# Patient Record
Sex: Male | Born: 2000 | Race: Black or African American | Hispanic: No | Marital: Single | Smoking: Former smoker
Health system: Southern US, Community
[De-identification: ages and names within clinical notes are randomized; demographics above are authoritative.]

## PROBLEM LIST (undated history)

## (undated) DIAGNOSIS — G4730 Sleep apnea, unspecified: Secondary | ICD-10-CM

## (undated) DIAGNOSIS — J189 Pneumonia, unspecified organism: Secondary | ICD-10-CM

## (undated) DIAGNOSIS — J302 Other seasonal allergic rhinitis: Secondary | ICD-10-CM

## (undated) HISTORY — PX: FRACTURE SURGERY: SHX138

## (undated) HISTORY — PX: WISDOM TOOTH EXTRACTION: SHX21

---
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
## 2018-01-17 ENCOUNTER — Ambulatory Visit (HOSPITAL_COMMUNITY)
Admission: EM | Admit: 2018-01-17 | Discharge: 2018-01-17 | Disposition: A | Discharge status: Home / Self Care | Payer: Medicaid Other | Attending: Family Medicine | Admitting: Family Medicine

## 2018-01-17 ENCOUNTER — Encounter (HOSPITAL_COMMUNITY): Payer: Self-pay | Admitting: Emergency Medicine

## 2018-01-17 DIAGNOSIS — J029 Acute pharyngitis, unspecified: Secondary | ICD-10-CM | POA: Insufficient documentation

## 2018-01-17 DIAGNOSIS — J020 Streptococcal pharyngitis: Secondary | ICD-10-CM | POA: Diagnosis not present

## 2018-01-17 LAB — POCT RAPID STREP A: STREPTOCOCCUS, GROUP A SCREEN (DIRECT): NEGATIVE

## 2018-01-17 NOTE — Discharge Instructions (Addendum)
You may use over the counter ibuprofen or acetaminophen as needed.  For a sore throat, over the counter products such as [employer] Peroxyl Mouth Sore Rinse or Chloraseptic Sore Throat Spray may provide some temporary relief. Your rapid strep test was negative today. We have sent your throat swab for culture and will let you know of any positive results. 

## 2018-01-17 NOTE — ED Triage Notes (Signed)
Pt sts URI sx and sore throat  

## 2018-01-17 NOTE — ED Provider Notes (Signed)
  [facility] CARE CENTER   [id number] 01/17/18 Arrival Time: 1219  ASSESSMENT & PLAN:  1. Sore throat    Results for orders placed or performed during the hospital encounter of 01/17/18  POCT rapid strep A [facility])  Result Value Ref Range   Streptococcus, Group A Screen (Direct) NEGATIVE NEGATIVE   Labs Reviewed  CULTURE, GROUP A STREP [facility])  POCT RAPID STREP A   OTC analgesics and throat care as needed. Will call if culture returns +.  Reviewed expectations re: course of current medical issues. Questions answered. Outlined signs and symptoms indicating need for more acute intervention. Patient verbalized understanding. After Visit Summary given.   SUBJECTIVE:  Justin Fletcher is a 17 y.o. male who reports a sore throat. Describes as discomfort with swallowing. Onset gradual beginning 1-2 days ago. No respiratory symptoms. Normal PO intake but reports discomfort with swallowing. Fever reported: no. No associated n/v/abdominal symptoms. Sick contacts: none.  OTC treatment: none reported.  ROS: As per HPI.   OBJECTIVE:  Vitals:   01/17/18 1259  BP: 121/75  Pulse: 77  Resp: 18  Temp: 98.4 F (36.9 C)  TempSrc: Oral  SpO2: 99%     General appearance: alert; no distress HEENT: throat with mild erythema; uvula midline yes Neck: supple with FROM; no lymphadenopathy Lungs: clear to auscultation bilaterally Skin: reveals no rash; warm and dry Psychological: alert and cooperative; normal mood and affect  No Known Allergies  Past Medical History:  Diagnosis Date  . Asthma   . Bronchitis   . Pneumonia   . Seasonal allergies    Social History   Socioeconomic History  . Marital status: Single    Spouse name: Not on file  . Number of children: Not on file  . Years of education: Not on file  . Highest education level: Not on file  Occupational History  . Not on file  Social Needs  . Financial resource strain: Not on file  . Food insecurity:    Worry:  Not on file    Inability: Not on file  . Transportation needs:    Medical: Not on file    Non-medical: Not on file  Tobacco Use  . Smoking status: Never Smoker  . Smokeless tobacco: Never Used  Substance and Sexual Activity  . Alcohol use: No  . Drug use: No  . Sexual activity: Not on file  Lifestyle  . Physical activity:    Days per week: Not on file    Minutes per session: Not on file  . Stress: Not on file  Relationships  . Social connections:    Talks on phone: Not on file    Gets together: Not on file    Attends religious service: Not on file    Active member of club or organization: Not on file    Attends meetings of clubs or organizations: Not on file    Relationship status: Not on file  . Intimate partner violence:    Fear of current or ex partner: Not on file    Emotionally abused: Not on file    Physically abused: Not on file    Forced sexual activity: Not on file  Other Topics Concern  . Not on file  Social History Narrative  . Not on file   History reviewed. No pertinent family history.        Mardella Layman, MD 01/18/18 [phone]

## 2018-01-19 LAB — CULTURE, GROUP A STREP (THRC)

## 2018-01-23 ENCOUNTER — Other Ambulatory Visit: Payer: Self-pay

## 2018-01-23 ENCOUNTER — Encounter (HOSPITAL_COMMUNITY): Payer: Self-pay

## 2018-01-23 ENCOUNTER — Ambulatory Visit (HOSPITAL_COMMUNITY)
Admission: EM | Admit: 2018-01-23 | Discharge: 2018-01-23 | Disposition: A | Discharge status: Home / Self Care | Payer: Medicaid Other | Attending: Family Medicine | Admitting: Family Medicine

## 2018-01-23 DIAGNOSIS — J302 Other seasonal allergic rhinitis: Secondary | ICD-10-CM

## 2018-01-23 DIAGNOSIS — H66003 Acute suppurative otitis media without spontaneous rupture of ear drum, bilateral: Secondary | ICD-10-CM

## 2018-01-23 DIAGNOSIS — J4521 Mild intermittent asthma with (acute) exacerbation: Secondary | ICD-10-CM | POA: Diagnosis not present

## 2018-01-23 MED ORDER — OLOPATADINE HCL 0.2 % OP SOLN: 1.0000 [drp] | Freq: Every morning | OPHTHALMIC | 3 refills | Status: DC

## 2018-01-23 MED ORDER — FLUTICASONE PROPIONATE 50 MCG/ACT NA SUSP: 2.0000 | Freq: Every day | NASAL | 12 refills | Status: DC

## 2018-01-23 MED ORDER — ALBUTEROL SULFATE HFA 108 (90 BASE) MCG/ACT IN AERS: 1.0000 | INHALATION_SPRAY | Freq: Four times a day (QID) | RESPIRATORY_TRACT | 11 refills | Status: DC | PRN

## 2018-01-23 MED ORDER — AMOXICILLIN 875 MG PO TABS: 875.0000 mg | ORAL_TABLET | Freq: Two times a day (BID) | ORAL | 0 refills | Status: DC

## 2018-01-23 NOTE — ED Provider Notes (Signed)
[facility] CARE CENTER   [id number] 01/23/18 Arrival Time: 1818   SUBJECTIVE:  Justin Fletcher is a 17 y.o. male who presents to the urgent care with complaint of bilateral ear pain since yesterday 01/22/18, pt complains of water feeling in ears also when he talks sounds muffled   Patient has asthma and seasonal allergies.  His nose been stuffy and his eyes have been watery, swollen, and red.  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  He hopes to go to truck driving school next.  Past Medical History:  Diagnosis Date  . Asthma   . Bronchitis   . Pneumonia   . Seasonal allergies    History reviewed. No pertinent family history. Social History   Socioeconomic History  . Marital status: Single    Spouse name: Not on file  . Number of children: Not on file  . Years of education: Not on file  . Highest education level: Not on file  Occupational History  . Not on file  Social Needs  . Financial resource strain: Not on file  . Food insecurity:    Worry: Not on file    Inability: Not on file  . Transportation needs:    Medical: Not on file    Non-medical: Not on file  Tobacco Use  . Smoking status: Never Smoker  . Smokeless tobacco: Never Used  Substance and Sexual Activity  . Alcohol use: No  . Drug use: No  . Sexual activity: Never  Lifestyle  . Physical activity:    Days per week: Not on file    Minutes per session: Not on file  . Stress: Not on file  Relationships  . Social connections:    Talks on phone: Not on file    Gets together: Not on file    Attends religious service: Not on file    Active member of club or organization: Not on file    Attends meetings of clubs or organizations: Not on file    Relationship status: Not on file  . Intimate partner violence:    Fear of current or ex partner: Not on file    Emotionally abused: Not on file    Physically abused: Not on file    Forced sexual activity: Not on file  Other Topics Concern  . Not on file    Social History Narrative  . Not on file   Current Meds  Medication Sig  . [DISCONTINUED] albuterol (PROVENTIL HFA;VENTOLIN HFA) 108 (90 Base) MCG/ACT inhaler Inhale 1-2 puffs into the lungs every 6 (six) hours as needed for wheezing or shortness of breath.  . [DISCONTINUED] albuterol (PROVENTIL HFA;VENTOLIN HFA) 108 (90 Base) MCG/ACT inhaler Inhale 1-2 puffs every 6 (six) hours as needed into the lungs for wheezing or shortness of breath.   No Known Allergies    ROS: As per HPI, remainder of ROS negative.   OBJECTIVE:   Vitals:   01/23/18 1850 01/23/18 1852  BP: 132/81   Pulse: 92   Resp: 18   Temp: 97.7 F (36.5 C)   TempSrc: Oral   SpO2: 97% 97%     General appearance: alert; no distress Eyes: PERRL; EOMI; conjunctiva erythematous bilaterally with mildly swollen eyelids HENT: normocephalic; atraumatic; TMs bilateral bulging eardrums with erythema and fluid level, canal normal, external ears normal without trauma; nasal mucosa normal; oral mucosa normal Neck: supple Lungs: Wheezing on auscultation bilaterally Heart: regular rate and rhythm Back: no CVA tenderness Extremities: no cyanosis or edema; symmetrical with  no gross deformities Skin: warm and dry Neurologic: normal gait; grossly normal Psychological: alert and cooperative; normal mood and affect      Labs:  Results for orders placed or performed during the hospital encounter of 01/17/18  Culture, group A strep  Result Value Ref Range   Specimen Description THROAT    Special Requests NONE    Culture      NO GROUP A STREP (S.PYOGENES) ISOLATED Performed at [facility], [address] [place], [place] [address]    Report Status 01/19/2018 FINAL   POCT rapid strep A [facility])  Result Value Ref Range   Streptococcus, Group A Screen (Direct) NEGATIVE NEGATIVE    Labs Reviewed - No data to display  No results found.     ASSESSMENT & PLAN:  1. Non-recurrent acute suppurative  otitis media of both ears without spontaneous rupture of tympanic membranes   2. Seasonal allergies   3. Mild intermittent asthma with acute exacerbation   Patient has multiple symptoms referable to seasonal allergies.  The ear infections are quite significant but patient is able to go to school anyway.  His asthma is mild but persistent lately  Meds ordered this encounter  Medications  . albuterol (PROVENTIL HFA;VENTOLIN HFA) 108 (90 Base) MCG/ACT inhaler    Sig: Inhale 1-2 puffs into the lungs every 6 (six) hours as needed for wheezing or shortness of breath.    Dispense:  1 Inhaler    Refill:  11  . fluticasone (FLONASE) 50 MCG/ACT nasal spray    Sig: Place 2 sprays into both nostrils daily.    Dispense:  16 g    Refill:  12  . amoxicillin (AMOXIL) 875 MG tablet    Sig: Take 1 tablet (875 mg total) by mouth 2 (two) times daily.    Dispense:  20 tablet    Refill:  0  . Olopatadine HCl (PATADAY) 0.2 % SOLN    Sig: Apply 1 drop to eye every morning.    Dispense:  2.5 mL    Refill:  3    Reviewed expectations re: course of current medical issues. Questions answered. Outlined signs and symptoms indicating need for more acute intervention. Patient verbalized understanding. After Visit Summary given.    Procedures:      Elvina Sidle, MD 01/23/18 1909

## 2018-01-23 NOTE — ED Triage Notes (Addendum)
Patient presents to [facility] for bilateral ear pain since yesterday 01/22/18, pt complains of water feeling in ears also when he talks sounds muffled

## 2018-02-23 ENCOUNTER — Encounter (HOSPITAL_COMMUNITY): Payer: Self-pay | Admitting: Emergency Medicine

## 2018-02-23 ENCOUNTER — Emergency Department (HOSPITAL_COMMUNITY)
Admission: EM | Admit: 2018-02-23 | Discharge: 2018-02-24 | Disposition: A | Discharge status: Home / Self Care | Payer: Medicaid Other | Attending: Physician Assistant | Admitting: Physician Assistant

## 2018-02-23 DIAGNOSIS — Z79899 Other long term (current) drug therapy: Secondary | ICD-10-CM | POA: Diagnosis not present

## 2018-02-23 DIAGNOSIS — J302 Other seasonal allergic rhinitis: Secondary | ICD-10-CM | POA: Insufficient documentation

## 2018-02-23 DIAGNOSIS — J039 Acute tonsillitis, unspecified: Secondary | ICD-10-CM | POA: Diagnosis not present

## 2018-02-23 DIAGNOSIS — J45909 Unspecified asthma, uncomplicated: Secondary | ICD-10-CM | POA: Diagnosis not present

## 2018-02-23 DIAGNOSIS — J029 Acute pharyngitis, unspecified: Secondary | ICD-10-CM | POA: Diagnosis present

## 2018-02-23 LAB — GROUP A STREP BY PCR: Group A Strep by PCR: NOT DETECTED

## 2018-02-23 MED ORDER — ACETAMINOPHEN 325 MG PO TABS
650.0000 mg | ORAL_TABLET | Freq: Once | ORAL | Status: AC | PRN
  Administered 2018-02-23: 650 mg via ORAL
  Filled 2018-02-23: qty 2

## 2018-02-23 NOTE — ED Triage Notes (Signed)
Pt states sore throat with body aches since yesterday. Also reports cough with congestion.

## 2018-02-24 MED ORDER — PENICILLIN V POTASSIUM 500 MG PO TABS: 500.0000 mg | ORAL_TABLET | Freq: Four times a day (QID) | ORAL | 0 refills | Status: AC

## 2018-02-24 NOTE — ED Provider Notes (Signed)
[facility] Provider Note   CSN: [id number] Arrival date & time: 02/23/18  2237     History   Chief Complaint No chief complaint on file.   HPI Justin HandlerJaidyn Fletcher is a 17 y.o. male.  The history is provided by the patient. No language interpreter was used.  Sore Throat  This is a new problem. The current episode started yesterday. The problem occurs constantly. The problem has been gradually worsening. Nothing aggravates the symptoms. Nothing relieves the symptoms. He has tried nothing for the symptoms. The treatment provided no relief.  Pt complains of a sore throat.  Pt complains of pain with swallowing    Past Medical History:  Diagnosis Date  . Asthma   . Bronchitis   . Pneumonia   . Seasonal allergies     There are no active problems to display for this patient.   Past Surgical History:  Procedure Laterality Date  . HAND SURGERY          Home Medications    Prior to Admission medications   Medication Sig Start Date End Date Taking? Authorizing Provider  albuterol (PROVENTIL HFA;VENTOLIN HFA) 108 (90 Base) MCG/ACT inhaler Inhale 1-2 puffs into the lungs every 6 (six) hours as needed for wheezing or shortness of breath. 01/23/18   Elvina SidleLauenstein, Kurt, MD  amoxicillin (AMOXIL) 875 MG tablet Take 1 tablet (875 mg total) by mouth 2 (two) times daily. 01/23/18   Elvina SidleLauenstein, Kurt, MD  fluticasone (FLONASE) 50 MCG/ACT nasal spray Place 2 sprays into both nostrils daily. 01/23/18   Elvina SidleLauenstein, Kurt, MD  Olopatadine HCl (PATADAY) 0.2 % SOLN Apply 1 drop to eye every morning. 01/23/18   Elvina SidleLauenstein, Kurt, MD  penicillin v potassium (VEETID) 500 MG tablet Take 1 tablet (500 mg total) by mouth 4 (four) times daily for 7 days. 02/24/18 03/03/18  Elson AreasSofia, Leslie K, PA-C    Family History No family history on file.  Social History Social History   Tobacco Use  . Smoking status: Never Smoker  . Smokeless tobacco: Never Used  Substance Use Topics    . Alcohol use: No  . Drug use: No     Allergies   Patient has no known allergies.   Review of Systems Review of Systems  All other systems reviewed and are negative.    Physical Exam Updated Vital Signs BP (!) 160/102   Pulse (!) 121   Temp (!) 102 F (38.9 C)   Resp 19   SpO2 100%   Physical Exam  Constitutional: He is oriented to person, place, and time. He appears well-developed and well-nourished.  HENT:  Head: Normocephalic.  Eyes: Pupils are equal, round, and reactive to light. EOM are normal.  Swollen red tonsils   Neck: Normal range of motion.  Cardiovascular: Normal rate.  Abdominal: Soft.  Neurological: He is alert and oriented to person, place, and time.  Skin: Skin is warm.  Psychiatric: He has a normal mood and affect.  Nursing note and vitals reviewed.    ED Treatments / Results  Labs (all labs ordered are listed, but only abnormal results are displayed) Labs Reviewed  GROUP A STREP BY PCR    EKG None  Radiology No results found.  Procedures Procedures (including critical care time)  Medications Ordered in ED Medications  acetaminophen (TYLENOL) tablet 650 mg (650 mg Oral Given 02/23/18 2259)     Initial Impression / Assessment and Plan / ED Course  I have reviewed the triage vital  signs and the nursing notes.  Pertinent labs & imaging results that were available during my care of the patient were reviewed by me and considered in my medical decision making (see chart for details).     Strep is negative,  Pt given pcn to cover tonsillitis.   Final Clinical Impressions(s) / ED Diagnoses   Final diagnoses:  Tonsillitis    ED Discharge Orders        Ordered    penicillin v potassium (VEETID) 500 MG tablet  4 times daily     02/24/18 0019    An After Visit Summary was printed and given to the patient.    Elson Areas, [place] 02/24/18 1540    Mackuen, Cindee Salt, MD 02/27/18 2357

## 2018-02-24 NOTE — Discharge Instructions (Signed)
Return if any problems.

## 2018-04-13 ENCOUNTER — Other Ambulatory Visit: Payer: Self-pay

## 2018-04-13 ENCOUNTER — Ambulatory Visit (HOSPITAL_COMMUNITY)
Admission: EM | Admit: 2018-04-13 | Discharge: 2018-04-13 | Disposition: A | Discharge status: Home / Self Care | Payer: Medicaid Other | Attending: Family Medicine | Admitting: Family Medicine

## 2018-04-13 ENCOUNTER — Encounter (HOSPITAL_COMMUNITY): Payer: Self-pay

## 2018-04-13 DIAGNOSIS — J020 Streptococcal pharyngitis: Secondary | ICD-10-CM

## 2018-04-13 DIAGNOSIS — R509 Fever, unspecified: Secondary | ICD-10-CM

## 2018-04-13 DIAGNOSIS — J029 Acute pharyngitis, unspecified: Secondary | ICD-10-CM

## 2018-04-13 DIAGNOSIS — R51 Headache: Secondary | ICD-10-CM

## 2018-04-13 LAB — POCT RAPID STREP A: Streptococcus, Group A Screen (Direct): POSITIVE — AB

## 2018-04-13 MED ORDER — PENICILLIN V POTASSIUM 500 MG PO TABS: 500.0000 mg | ORAL_TABLET | Freq: Two times a day (BID) | ORAL | 0 refills | Status: AC

## 2018-04-13 NOTE — ED Provider Notes (Signed)
[facility]    CSN: [id number] Arrival date & time: 04/13/18  1155     History   Chief Complaint Chief Complaint  Patient presents with  . Sore Throat    HPI Justin Fletcher is a 17 y.o. male.   HPI  Patient with recurrent tonsillitis Is scheduled to see an ENT next week For 2 d has has sweats, chills, very painful sore throat, headache and decreased appetite. No cough or wheezing.  NO runny or stuffy nose.  No known exposure to strep Body aches and fatigue No rash   Past Medical History:  Diagnosis Date  . Asthma   . Bronchitis   . Pneumonia   . Seasonal allergies     There are no active problems to display for this patient.   Past Surgical History:  Procedure Laterality Date  . HAND SURGERY         Home Medications    Prior to Admission medications   Medication Sig Start Date End Date Taking? Authorizing Provider  albuterol (PROVENTIL HFA;VENTOLIN HFA) 108 (90 Base) MCG/ACT inhaler Inhale 1-2 puffs into the lungs every 6 (six) hours as needed for wheezing or shortness of breath. 01/23/18   Elvina Sidle, MD  fluticasone (FLONASE) 50 MCG/ACT nasal spray Place 2 sprays into both nostrils daily. 01/23/18   Elvina Sidle, MD  penicillin v potassium (VEETID) 500 MG tablet Take 1 tablet (500 mg total) by mouth 2 (two) times daily for 10 days. 04/13/18 04/23/18  Eustace Moore, MD    Family History History reviewed. No pertinent family history. No family history asthma or allergies Social History Social History   Tobacco Use  . Smoking status: Never Smoker  . Smokeless tobacco: Never Used  Substance Use Topics  . Alcohol use: No  . Drug use: No     Allergies   Patient has no known allergies.   Review of Systems Review of Systems  Constitutional: Positive for fatigue. Negative for chills and fever.  HENT: Positive for sore throat and voice change. Negative for congestion, ear pain, sinus pressure, sinus pain and trouble swallowing.    Eyes: Negative for pain and visual disturbance.  Respiratory: Negative for cough, shortness of breath and wheezing.   Cardiovascular: Negative for chest pain and palpitations.  Gastrointestinal: Negative for abdominal pain, nausea and vomiting.  Genitourinary: Negative for dysuria and hematuria.  Musculoskeletal: Negative for arthralgias and back pain.  Skin: Negative for color change and rash.  Neurological: Positive for headaches. Negative for seizures and syncope.  All other systems reviewed and are negative.    Physical Exam Triage Vital Signs ED Triage Vitals  Enc Vitals Group     BP 04/13/18 1227 115/85     Pulse Rate 04/13/18 1227 97     Resp 04/13/18 1227 16     Temp 04/13/18 1227 98.5 F (36.9 C)     Temp Source 04/13/18 1227 Oral     SpO2 04/13/18 1227 98 %     Weight 04/13/18 1226 270 lb (122.5 kg)     Height --      Head Circumference --      Peak Flow --      Pain Score 04/13/18 1225 10     Pain Loc --      Pain Edu? --      Excl. in GC? --    No data found.  Updated Vital Signs BP 115/85 (BP Location: Left Arm)   Pulse 97  Temp 98.5 F (36.9 C) (Oral)   Resp 16   Wt 270 lb (122.5 kg)   SpO2 98%   BMI 41.05 kg/m      Physical Exam  Constitutional: He appears well-developed and well-nourished. He appears ill. No distress.  Appears moderately ill.  Muffled voice.  HENT:  Head: Normocephalic and atraumatic.  Right Ear: Tympanic membrane and ear canal normal.  Left Ear: Tympanic membrane and ear canal normal.  Mouth/Throat: Oropharynx is clear and moist and mucous membranes are normal. No posterior oropharyngeal erythema or tonsillar abscesses. Tonsils are 3+ on the right. Tonsils are 3+ on the left. Tonsillar exudate.  No sinus tenderness.  Scattered petechia on soft palate  Eyes: Pupils are equal, round, and reactive to light. Conjunctivae are normal.  Neck: Normal range of motion.  Cardiovascular: Normal rate, regular rhythm and normal heart  sounds.  Pulmonary/Chest: Effort normal and breath sounds normal. No respiratory distress.  Abdominal: Soft. He exhibits no distension.  Musculoskeletal: Normal range of motion. He exhibits no edema.  Lymphadenopathy:    He has cervical adenopathy.  Neurological: He is alert.  Skin: Skin is warm and dry. No rash noted.  Psychiatric: He has a normal mood and affect. His behavior is normal.     UC Treatments / Results  Labs (all labs ordered are listed, but only abnormal results are displayed) Labs Reviewed  POCT RAPID STREP A - Abnormal; Notable for the following components:      Result Value   Streptococcus, Group A Screen (Direct) POSITIVE (*)    All other components within normal limits  CULTURE, GROUP A STREP [facility]    EKG None  Radiology No results found.  Procedures Procedures (including critical care time)  Medications Ordered in UC Medications - No data to display  Initial Impression / Assessment and Plan / UC Course  I have reviewed the triage vital signs and the nursing notes.  Pertinent labs & imaging results that were available during my care of the patient were reviewed by me and considered in my medical decision making (see chart for details).    POSITIVE STREP- patient was offered a shot of penicillin VK because of his throat pain difficulty swallowing.  He declined.  Prefers to take antibiotics by mouth. Final Clinical Impressions(s) / UC Diagnoses   Final diagnoses:  Strep throat     Discharge Instructions     Take 10 full days of antibiotics.  Make sure to take 2 doses today Take Tylenol or ibuprofen for pain Drink plenty of fluids May use salt water gargles or Chloraseptic spray for pain Return if unable to keep down your medication, or if worse instead of better, at any time    ED Prescriptions    Medication Sig Dispense Auth. Provider   penicillin v potassium (VEETID) 500 MG tablet Take 1 tablet (500 mg total) by mouth 2 (two) times  daily for 10 days. 20 tablet Eustace MooreNelson, Dalisa Forrer Sue, MD     Controlled Substance Prescriptions [place] Controlled Substance Registry consulted? Not Applicable   Eustace MooreNelson, Garin Mata Sue, MD 04/13/18 1302

## 2018-04-13 NOTE — Discharge Instructions (Signed)
Take 10 full days of antibiotics.  Make sure to take 2 doses today Take Tylenol or ibuprofen for pain Drink plenty of fluids May use salt water gargles or Chloraseptic spray for pain Return if unable to keep down your medication, or if worse instead of better, at any time

## 2018-04-13 NOTE — ED Triage Notes (Signed)
Sore throat and body aches..1 day

## 2018-04-17 ENCOUNTER — Other Ambulatory Visit: Payer: Self-pay | Admitting: Otolaryngology

## 2018-04-24 ENCOUNTER — Encounter (HOSPITAL_COMMUNITY): Payer: Self-pay | Admitting: Emergency Medicine

## 2018-04-24 ENCOUNTER — Ambulatory Visit (HOSPITAL_COMMUNITY)
Admission: EM | Admit: 2018-04-24 | Discharge: 2018-04-24 | Disposition: A | Discharge status: Home / Self Care | Payer: Medicaid Other | Attending: Family Medicine | Admitting: Family Medicine

## 2018-04-24 DIAGNOSIS — J039 Acute tonsillitis, unspecified: Secondary | ICD-10-CM

## 2018-04-24 MED ORDER — AMOXICILLIN-POT CLAVULANATE 875-125 MG PO TABS: 1.0000 | ORAL_TABLET | Freq: Two times a day (BID) | ORAL | 0 refills | Status: AC

## 2018-04-24 NOTE — ED Triage Notes (Signed)
Pt sts seen here last week with strep throat; pt sts only took part of antibiotics and now still having pain

## 2018-04-24 NOTE — ED Provider Notes (Signed)
[facility]    CSN: [id number] Arrival date & time: 04/24/18  1029     History   Chief Complaint Chief Complaint  Patient presents with  . Sore Throat    HPI Justin Fletcher is a 17 y.o. male.   Justin Fletcher presents with complaints of recurrent sore throat. He was seen and treated for strep 8/1, took course of pen v.  States symptoms had mildly improved until two days ago when returned. Has congestion as well. States he did miss one dose last week but then took medication the following day. Pain and difficulty swallowing. Saw ENT last week and is getting tonsillectomy scheduled as he has had recurrent tonsillitis and strep. No current fevers. No cough. No ear pain. Uses daily flonase as well as allergy medication which does help some with congestion.    ROS per HPI.      Past Medical History:  Diagnosis Date  . Asthma   . Bronchitis   . Pneumonia   . Seasonal allergies     There are no active problems to display for this patient.   Past Surgical History:  Procedure Laterality Date  . HAND SURGERY         Home Medications    Prior to Admission medications   Medication Sig Start Date End Date Taking? Authorizing Provider  albuterol (PROVENTIL HFA;VENTOLIN HFA) 108 (90 Base) MCG/ACT inhaler Inhale 1-2 puffs into the lungs every 6 (six) hours as needed for wheezing or shortness of breath. 01/23/18   Elvina SidleLauenstein, Kurt, MD  amoxicillin-clavulanate (AUGMENTIN) 875-125 MG tablet Take 1 tablet by mouth every 12 (twelve) hours for 10 days. 04/24/18 05/04/18  Georgetta HaberBurky, Natalie B, NP  fluticasone (FLONASE) 50 MCG/ACT nasal spray Place 2 sprays into both nostrils daily. 01/23/18   Elvina SidleLauenstein, Kurt, MD    Family History History reviewed. No pertinent family history.  Social History Social History   Tobacco Use  . Smoking status: Never Smoker  . Smokeless tobacco: Never Used  Substance Use Topics  . Alcohol use: No  . Drug use: No     Allergies   Patient has no  known allergies.   Review of Systems Review of Systems   Physical Exam Triage Vital Signs ED Triage Vitals [04/24/18 1115]  Enc Vitals Group     BP (!) 144/79     Pulse Rate 99     Resp 18     Temp 98 F (36.7 C)     Temp Source Oral     SpO2 97 %     Weight      Height      Head Circumference      Peak Flow      Pain Score      Pain Loc      Pain Edu?      Excl. in GC?    No data found.  Updated Vital Signs BP (!) 144/79 (BP Location: Left Arm)   Pulse 99   Temp 98 F (36.7 C) (Oral)   Resp 18   SpO2 97%    Physical Exam  Constitutional: He is oriented to person, place, and time. He appears well-developed and well-nourished.  HENT:  Head: Normocephalic and atraumatic.  Right Ear: Tympanic membrane, external ear and ear canal normal.  Left Ear: Tympanic membrane, external ear and ear canal normal.  Nose: Nose normal. Right sinus exhibits no maxillary sinus tenderness and no frontal sinus tenderness. Left sinus exhibits no maxillary sinus tenderness and no  frontal sinus tenderness.  Mouth/Throat: Uvula is midline and mucous membranes are normal. Posterior oropharyngeal erythema present. Tonsils are 2+ on the right. Tonsils are 2+ on the left. Tonsillar exudate.  Muffled voice noted   Eyes: Pupils are equal, round, and reactive to light. Conjunctivae are normal.  Neck: Normal range of motion.  Cardiovascular: Normal rate and regular rhythm.  Pulmonary/Chest: Effort normal and breath sounds normal.  Lymphadenopathy:    He has no cervical adenopathy.  Neurological: He is alert and oriented to person, place, and time.  Skin: Skin is warm and dry.  Vitals reviewed.    UC Treatments / Results  Labs (all labs ordered are listed, but only abnormal results are displayed) Labs Reviewed - No data to display  EKG None  Radiology No results found.  Procedures Procedures (including critical care time)  Medications Ordered in UC Medications - No data to  display  Initial Impression / Assessment and Plan / UC Course  I have reviewed the triage vital signs and the nursing notes.  Pertinent labs & imaging results that were available during my care of the patient were reviewed by me and considered in my medical decision making (see chart for details).     Uncertain how well patient took last course of penicillin. Recurrent symptoms with exudate and swelling to tonsils. Course of augmentin provided at this time, encouraged to take as directed. Push fluids. Tylenol and/or ibuprofen as needed for pain or fevers.  Follow up with ENT as scheduled. Patient verbalized understanding and agreeable to plan.   Final Clinical Impressions(s) / UC Diagnoses   Final diagnoses:  Tonsillitis     Discharge Instructions     Push fluids to ensure adequate hydration and keep secretions thin.  Tylenol and/or ibuprofen as needed for pain or fevers.  Complete course of antibiotics.  Continue with previously prescribed medications.  Continue to follow with ENT for follow up as needed.    ED Prescriptions    Medication Sig Dispense Auth. Provider   amoxicillin-clavulanate (AUGMENTIN) 875-125 MG tablet Take 1 tablet by mouth every 12 (twelve) hours for 10 days. 20 tablet Georgetta HaberBurky, Natalie B, NP     Controlled Substance Prescriptions [place] Controlled Substance Registry consulted? Not Applicable   Georgetta HaberBurky, Natalie B, NP 04/24/18 1136

## 2018-04-24 NOTE — Discharge Instructions (Signed)
Push fluids to ensure adequate hydration and keep secretions thin.  Tylenol and/or ibuprofen as needed for pain or fevers.  Complete course of antibiotics.  Continue with previously prescribed medications.  Continue to follow with ENT for follow up as needed.

## 2018-05-19 ENCOUNTER — Encounter (HOSPITAL_COMMUNITY): Payer: Self-pay | Admitting: Emergency Medicine

## 2018-05-19 ENCOUNTER — Other Ambulatory Visit: Payer: Self-pay

## 2018-05-19 ENCOUNTER — Emergency Department (HOSPITAL_COMMUNITY): Payer: Medicaid Other

## 2018-05-19 ENCOUNTER — Emergency Department (HOSPITAL_COMMUNITY)
Admission: EM | Admit: 2018-05-19 | Discharge: 2018-05-19 | Disposition: A | Discharge status: Home / Self Care | Payer: Medicaid Other | Attending: Emergency Medicine | Admitting: Emergency Medicine

## 2018-05-19 DIAGNOSIS — J45909 Unspecified asthma, uncomplicated: Secondary | ICD-10-CM | POA: Diagnosis not present

## 2018-05-19 DIAGNOSIS — M79641 Pain in right hand: Secondary | ICD-10-CM | POA: Diagnosis present

## 2018-05-19 NOTE — Discharge Instructions (Addendum)
Your hand x-rays are normal. No new fractures or dislocations.  You may use Tylenol and/or Ibuprofen for pain relief and swelling. You may also use cold compresses/ice for additional relief. As we discussed, I recommend you take pain medication 3-4 times a day for the next 2-3 days for the swelling and to stay ahead of the pain.   Do not be surprised if you feel more pain and soreness tomorrow. It will take about a week or so until you start to feel back to normal.
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
## 2018-05-19 NOTE — ED Notes (Signed)
Pt verbalizes understanding of d/c instructions. Pt ambulatory at d/c with all belongings and with family.   
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
## 2018-05-19 NOTE — ED Notes (Signed)
See EDP assessment 

## 2018-05-20 ENCOUNTER — Encounter (HOSPITAL_COMMUNITY): Payer: Self-pay | Admitting: *Deleted

## 2018-05-20 NOTE — Progress Notes (Signed)
Patient denies chest pain or shortness of breath. States no cardiologist or cardiac test.

## 2018-06-12 NOTE — Pre-Procedure Instructions (Signed)
Justin Fletcher  06/12/2018      [id number] [place], [place] - [address] AT [facility] [address] [facility] [address] [place] [address] Phone: [phone] Fax: [phone]    Your procedure is scheduled on Friday October 4th.  Report to [facility] Admitting at 0530 A.M.  Call this number if you have problems the morning of surgery:  [phone]   Remember:  Do not eat or drink after midnight.     Take these medicines the morning of surgery with A SIP OF WATER   Albuterol (if needed) Bring inhaler with you  Flonase (if needed)  7 days prior to surgery STOP taking any Aspirin(unless otherwise instructed by your surgeon), Aleve, Naproxen, Ibuprofen, Motrin, Advil, Goody's, BC's, all herbal medications, fish oil, and all vitamins     Do not wear jewelry  Do not wear lotions, powders, or colognes, or deodorant.  Do not shave 48 hours prior to surgery.  Men may shave face and neck.  Do not bring valuables to the hospital.  [facility] is not responsible for any belongings or valuables.  Contacts, dentures or bridgework may not be worn into surgery.  Leave your suitcase in the car.  After surgery it may be brought to your room.  For patients admitted to the hospital, discharge time will be determined by your treatment team.  Patients discharged the day of surgery will not be allowed to drive home.    [place]- Preparing For Surgery  Before surgery, you can play an important role. Because skin is not sterile, your skin needs to be as free of germs as possible. You can reduce the number of germs on your skin by washing with CHG (chlorahexidine gluconate) Soap before surgery.  CHG is an antiseptic cleaner which kills germs and bonds with the skin to continue killing germs even after washing.    Oral Hygiene is also important to reduce your risk of infection.  Remember - BRUSH YOUR TEETH THE MORNING OF SURGERY WITH YOUR REGULAR  TOOTHPASTE  Please do not use if you have an allergy to CHG or antibacterial soaps. If your skin becomes reddened/irritated stop using the CHG.  Do not shave (including legs and underarms) for at least 48 hours prior to first CHG shower. It is OK to shave your face.  Please follow these instructions carefully.   1. Shower the NIGHT BEFORE SURGERY and the MORNING OF SURGERY with CHG.   2. If you chose to wash your hair, wash your hair first as usual with your normal shampoo.  3. After you shampoo, rinse your hair and body thoroughly to remove the shampoo.  4. Use CHG as you would any other liquid soap. You can apply CHG directly to the skin and wash gently with a scrungie or a clean washcloth.   5. Apply the CHG Soap to your body ONLY FROM THE NECK DOWN.  Do not use on open wounds or open sores. Avoid contact with your eyes, ears, mouth and genitals (private parts). Wash Face and genitals (private parts)  with your normal soap.  6. Wash thoroughly, paying special attention to the area where your surgery will be performed.  7. Thoroughly rinse your body with warm water from the neck down.  8. DO NOT shower/wash with your normal soap after using and rinsing off the CHG Soap.  9. Pat yourself dry with a CLEAN TOWEL.  10. Wear  CLEAN PAJAMAS to bed the night before surgery, wear comfortable clothes the morning of surgery  11. Place CLEAN SHEETS on your bed the night of your first shower and DO NOT SLEEP WITH PETS.    Day of Surgery:  Do not apply any deodorants/lotions.  Please wear clean clothes to the hospital/surgery center.   Remember to brush your teeth WITH YOUR REGULAR TOOTHPASTE.    Please read over the following fact sheets that you were given.

## 2018-06-13 ENCOUNTER — Encounter (HOSPITAL_COMMUNITY)
Admission: RE | Admit: 2018-06-13 | Discharge: 2018-06-13 | Disposition: A | Discharge status: Home / Self Care | Payer: Medicaid Other | Source: Ambulatory Visit | Attending: Otolaryngology | Admitting: Otolaryngology

## 2018-06-13 ENCOUNTER — Encounter (HOSPITAL_COMMUNITY): Payer: Self-pay

## 2018-06-13 ENCOUNTER — Other Ambulatory Visit: Payer: Self-pay

## 2018-06-13 DIAGNOSIS — Z01818 Encounter for other preprocedural examination: Secondary | ICD-10-CM | POA: Diagnosis present

## 2018-06-13 DIAGNOSIS — J029 Acute pharyngitis, unspecified: Secondary | ICD-10-CM | POA: Insufficient documentation

## 2018-06-13 LAB — HEMOGLOBIN: Hemoglobin: 15.4 g/dL (ref 13.0–17.0)

## 2018-06-13 NOTE — Progress Notes (Signed)
PCP - N/A  Chest x-ray - 10/19/17 EKG - 07/25/17  Sleep Study - ? CPAP -  Doesn't use   Blood Thinner Instructions: N/A Aspirin Instructions: N/A  Anesthesia review: none  Patient denies shortness of breath, fever, cough and chest pain at PAT appointment   Patient verbalized understanding of instructions that were given to them at the PAT appointment. Patient was also instructed that they will need to review over the PAT instructions again at home before surgery.

## 2018-06-16 ENCOUNTER — Encounter (HOSPITAL_COMMUNITY): Payer: Self-pay

## 2018-06-16 ENCOUNTER — Ambulatory Visit (HOSPITAL_COMMUNITY): Payer: Medicaid Other | Admitting: Certified Registered Nurse Anesthetist

## 2018-06-16 ENCOUNTER — Other Ambulatory Visit: Payer: Self-pay

## 2018-06-16 ENCOUNTER — Encounter (HOSPITAL_COMMUNITY)
Admission: RE | Disposition: A | Discharge status: Home / Self Care | Payer: Self-pay | Source: Ambulatory Visit | Attending: Otolaryngology

## 2018-06-16 ENCOUNTER — Observation Stay (HOSPITAL_COMMUNITY)
Admission: RE | Admit: 2018-06-16 | Discharge: 2018-06-17 | Disposition: A | Discharge status: Home / Self Care | Payer: Medicaid Other | Source: Ambulatory Visit | Attending: Otolaryngology | Admitting: Otolaryngology

## 2018-06-16 DIAGNOSIS — F17290 Nicotine dependence, other tobacco product, uncomplicated: Secondary | ICD-10-CM | POA: Insufficient documentation

## 2018-06-16 DIAGNOSIS — Z79899 Other long term (current) drug therapy: Secondary | ICD-10-CM | POA: Insufficient documentation

## 2018-06-16 DIAGNOSIS — J3501 Chronic tonsillitis: Principal | ICD-10-CM | POA: Insufficient documentation

## 2018-06-16 DIAGNOSIS — J039 Acute tonsillitis, unspecified: Secondary | ICD-10-CM | POA: Diagnosis present

## 2018-06-16 HISTORY — DX: Pneumonia, unspecified organism: J18.9

## 2018-06-16 HISTORY — DX: Sleep apnea, unspecified: G47.30

## 2018-06-16 HISTORY — PX: TONSILLECTOMY: SHX5217

## 2018-06-16 HISTORY — PX: TONSILLECTOMY: SUR1361

## 2018-06-16 SURGERY — TONSILLECTOMY
Anesthesia: General | Site: Throat | Laterality: Bilateral

## 2018-06-16 MED ORDER — PROPOFOL 10 MG/ML IV BOLUS
INTRAVENOUS | Status: AC
  Filled 2018-06-16: qty 40

## 2018-06-16 MED ORDER — CHLORHEXIDINE GLUCONATE CLOTH 2 % EX PADS: 6.0000 | MEDICATED_PAD | Freq: Once | CUTANEOUS | Status: DC

## 2018-06-16 MED ORDER — ONDANSETRON HCL 4 MG/2ML IJ SOLN: 4.0000 mg | Freq: Four times a day (QID) | INTRAMUSCULAR | Status: DC | PRN

## 2018-06-16 MED ORDER — PROMETHAZINE HCL 25 MG/ML IJ SOLN: 6.2500 mg | INTRAMUSCULAR | Status: DC | PRN

## 2018-06-16 MED ORDER — LACTATED RINGERS IV SOLN: INTRAVENOUS | Status: DC

## 2018-06-16 MED ORDER — LIDOCAINE 2% (20 MG/ML) 5 ML SYRINGE
INTRAMUSCULAR | Status: AC
  Filled 2018-06-16: qty 5

## 2018-06-16 MED ORDER — FENTANYL CITRATE (PF) 100 MCG/2ML IJ SOLN
INTRAMUSCULAR | Status: DC | PRN
  Administered 2018-06-16: 125 ug via INTRAVENOUS

## 2018-06-16 MED ORDER — ALBUTEROL SULFATE (2.5 MG/3ML) 0.083% IN NEBU: 3.0000 mL | INHALATION_SOLUTION | Freq: Four times a day (QID) | RESPIRATORY_TRACT | Status: DC | PRN

## 2018-06-16 MED ORDER — LACTATED RINGERS IV SOLN
INTRAVENOUS | Status: DC | PRN
  Administered 2018-06-16: 07:00:00 via INTRAVENOUS

## 2018-06-16 MED ORDER — MEPERIDINE HCL 50 MG/ML IJ SOLN: 6.2500 mg | INTRAMUSCULAR | Status: DC | PRN

## 2018-06-16 MED ORDER — HYDROCODONE-ACETAMINOPHEN 5-325 MG PO TABS
1.0000 | ORAL_TABLET | ORAL | Status: DC | PRN
  Administered 2018-06-16: 1 via ORAL
  Filled 2018-06-16: qty 1

## 2018-06-16 MED ORDER — ONDANSETRON HCL 4 MG/2ML IJ SOLN
INTRAMUSCULAR | Status: DC | PRN
  Administered 2018-06-16: 4 mg via INTRAVENOUS

## 2018-06-16 MED ORDER — DEXAMETHASONE SODIUM PHOSPHATE 10 MG/ML IJ SOLN
INTRAMUSCULAR | Status: AC
  Filled 2018-06-16: qty 1

## 2018-06-16 MED ORDER — HYDROMORPHONE HCL 1 MG/ML IJ SOLN: 0.2500 mg | INTRAMUSCULAR | Status: DC | PRN

## 2018-06-16 MED ORDER — PROPOFOL 10 MG/ML IV BOLUS
INTRAVENOUS | Status: DC | PRN
  Administered 2018-06-16: 50 mg via INTRAVENOUS
  Administered 2018-06-16: 200 mg via INTRAVENOUS

## 2018-06-16 MED ORDER — SUCCINYLCHOLINE CHLORIDE 200 MG/10ML IV SOSY
PREFILLED_SYRINGE | INTRAVENOUS | Status: AC
  Filled 2018-06-16: qty 10

## 2018-06-16 MED ORDER — MORPHINE SULFATE (PF) 2 MG/ML IV SOLN: 2.0000 mg | INTRAVENOUS | Status: DC | PRN

## 2018-06-16 MED ORDER — DEXMEDETOMIDINE HCL IN NACL 200 MCG/50ML IV SOLN
INTRAVENOUS | Status: DC | PRN
  Administered 2018-06-16: 8 ug via INTRAVENOUS

## 2018-06-16 MED ORDER — OXYCODONE HCL 5 MG/5ML PO SOLN: 5.0000 mg | ORAL | 0 refills | Status: AC | PRN

## 2018-06-16 MED ORDER — ONDANSETRON 4 MG PO TBDP: 4.0000 mg | ORAL_TABLET | Freq: Four times a day (QID) | ORAL | Status: DC | PRN

## 2018-06-16 MED ORDER — DEXAMETHASONE SODIUM PHOSPHATE 10 MG/ML IJ SOLN
INTRAMUSCULAR | Status: DC | PRN
  Administered 2018-06-16: 5 mg via INTRAVENOUS

## 2018-06-16 MED ORDER — FENTANYL CITRATE (PF) 250 MCG/5ML IJ SOLN
INTRAMUSCULAR | Status: AC
  Filled 2018-06-16: qty 5

## 2018-06-16 MED ORDER — SUCCINYLCHOLINE 20MG/ML (10ML) SYRINGE FOR MEDFUSION PUMP - OPTIME
INTRAMUSCULAR | Status: DC | PRN
  Administered 2018-06-16: 120 mg via INTRAVENOUS

## 2018-06-16 MED ORDER — MIDAZOLAM HCL 5 MG/5ML IJ SOLN
INTRAMUSCULAR | Status: DC | PRN
  Administered 2018-06-16: 2 mg via INTRAVENOUS

## 2018-06-16 MED ORDER — LIDOCAINE 2% (20 MG/ML) 5 ML SYRINGE
INTRAMUSCULAR | Status: DC | PRN
  Administered 2018-06-16: 60 mg via INTRAVENOUS

## 2018-06-16 MED ORDER — MIDAZOLAM HCL 2 MG/2ML IJ SOLN
INTRAMUSCULAR | Status: AC
  Filled 2018-06-16: qty 2

## 2018-06-16 MED ORDER — DEXTROSE-NACL 5-0.45 % IV SOLN
INTRAVENOUS | Status: DC
  Administered 2018-06-16 – 2018-06-17 (×2): via INTRAVENOUS

## 2018-06-16 MED ORDER — ONDANSETRON HCL 4 MG/2ML IJ SOLN
INTRAMUSCULAR | Status: AC
  Filled 2018-06-16: qty 2

## 2018-06-16 SURGICAL SUPPLY — 40 items
BLADE SURG 10 STRL SS (BLADE) ×3 IMPLANT
BLADE SURG 15 STRL LF DISP TIS (BLADE) IMPLANT
BLADE SURG 15 STRL SS (BLADE)
CANISTER SUCT 3000ML PPV (MISCELLANEOUS) ×3 IMPLANT
CATH ROBINSON RED A/P 10FR (CATHETERS) IMPLANT
CLEANER TIP ELECTROSURG 2X2 (MISCELLANEOUS) ×3 IMPLANT
COAGULATOR SUCT 6 FR SWTCH (ELECTROSURGICAL) ×1
COAGULATOR SUCT SWTCH 10FR 6 (ELECTROSURGICAL) ×2 IMPLANT
COVER WAND RF STERILE (DRAPES) ×3 IMPLANT
CRADLE DONUT ADULT HEAD (MISCELLANEOUS) IMPLANT
DRAPE HALF SHEET 40X57 (DRAPES) IMPLANT
ELECT COATED BLADE 2.86 ST (ELECTRODE) ×3 IMPLANT
ELECT REM PT RETURN 9FT ADLT (ELECTROSURGICAL)
ELECT REM PT RETURN 9FT PED (ELECTROSURGICAL)
ELECTRODE REM PT RETRN 9FT PED (ELECTROSURGICAL) IMPLANT
ELECTRODE REM PT RTRN 9FT ADLT (ELECTROSURGICAL) IMPLANT
GAUZE 4X4 16PLY RFD (DISPOSABLE) ×3 IMPLANT
GLOVE SS BIOGEL STRL SZ 7.5 (GLOVE) ×1 IMPLANT
GLOVE SUPERSENSE BIOGEL SZ 7.5 (GLOVE) ×2
GOWN STRL REUS W/ TWL LRG LVL3 (GOWN DISPOSABLE) ×2 IMPLANT
GOWN STRL REUS W/TWL LRG LVL3 (GOWN DISPOSABLE) ×6
KIT BASIN OR (CUSTOM PROCEDURE TRAY) ×3 IMPLANT
KIT TURNOVER KIT B (KITS) ×3 IMPLANT
NDL HYPO 25GX1X1/2 BEV (NEEDLE) IMPLANT
NEEDLE HYPO 25GX1X1/2 BEV (NEEDLE) IMPLANT
NS IRRIG 1000ML POUR BTL (IV SOLUTION) ×3 IMPLANT
PACK SURGICAL SETUP 50X90 (CUSTOM PROCEDURE TRAY) ×3 IMPLANT
PAD ARMBOARD 7.5X6 YLW CONV (MISCELLANEOUS) ×6 IMPLANT
PENCIL FOOT CONTROL (ELECTRODE) ×3 IMPLANT
SPECIMEN JAR SMALL (MISCELLANEOUS) ×6 IMPLANT
SPONGE TONSIL TAPE 1 RFD (DISPOSABLE) ×3 IMPLANT
SYR BULB 3OZ (MISCELLANEOUS) ×3 IMPLANT
TOWEL OR 17X24 6PK STRL BLUE (TOWEL DISPOSABLE) ×6 IMPLANT
TUBE CONNECTING 12'X1/4 (SUCTIONS) ×1
TUBE CONNECTING 12X1/4 (SUCTIONS) ×2 IMPLANT
TUBE SALEM SUMP 10F W/ARV (TUBING) IMPLANT
TUBE SALEM SUMP 12R W/ARV (TUBING) IMPLANT
TUBE SALEM SUMP 14F W/ARV (TUBING) IMPLANT
TUBE SALEM SUMP 16 FR W/ARV (TUBING) ×2 IMPLANT
WATER STERILE IRR 1000ML POUR (IV SOLUTION) ×3 IMPLANT

## 2018-06-16 NOTE — Op Note (Signed)
Preop/postop diagnosis: Chronic tonsillitis Procedure: Tonsillectomy Anesthesia: Gen. Estimated blood loss: Less than 5 mL Indications: 17 year old with repetitive tonsillitis and cryptic debris that has been refractory medical therapy. His informed risks and benefits of the procedure and options were discussed all questions are answered and consent was obtained. Procedure: Patient taken aberrantly supine position after general endotracheal tube anesthesia was placed in the Rose position draped in usual sterile manner. The Crowe-Davis mouth gag was inserted retracted and suspended from the Mayo stand. The left tonsil begun making a left  Anterior tonsillar pillar incision identifying the capsule tonsil and removing electrocautery dissection along the capsule. The right tonsil was removed in the same fashion. Both tonsils are extremely cryptic. The tonsillar fossa were cauterized with suction cautery to gain good hemostasis. The Crowe-Davis was released and resuspended hemostasis present in all locations. The hypopharynx esophagus stomach were suctioned NG tube. The Crowe-Davis was removed the patient was awake and brought to recovery in stable condition counts correct

## 2018-06-16 NOTE — Anesthesia Preprocedure Evaluation (Addendum)
Anesthesia Evaluation  Patient identified by MRN, date of birth, ID band Patient awake    Reviewed: Allergy & Precautions, NPO status , Patient's Chart, lab work & pertinent test results  Airway Mallampati: II  TM Distance: >3 FB Neck ROM: Full    Dental  (+) Teeth Intact, Dental Advisory Given   Pulmonary asthma , sleep apnea , Current Smoker,    breath sounds clear to auscultation       Cardiovascular negative cardio ROS   Rhythm:Regular Rate:Normal     Neuro/Psych negative neurological ROS     GI/Hepatic negative GI ROS, Neg liver ROS,   Endo/Other  negative endocrine ROS  Renal/GU negative Renal ROS     Musculoskeletal negative musculoskeletal ROS (+)   Abdominal (+) + obese,   Peds  Hematology negative hematology ROS (+)   Anesthesia Other Findings   Reproductive/Obstetrics                            Lab Results  Component Value Date   WBC 8.3 07/23/2017   HGB 15.4 06/13/2018   HCT 44.3 07/23/2017   MCV 78.4 07/23/2017   PLT 315 07/23/2017    EKG: normal sinus rhythm.  Anesthesia Physical Anesthesia Plan  ASA: III  Anesthesia Plan: General   Post-op Pain Management:    Induction: Intravenous  PONV Risk Score and Plan: 2 and Ondansetron, Midazolam and Treatment may vary due to age or medical condition  Airway Management Planned: Oral ETT  Additional Equipment: None  Intra-op Plan:   Post-operative Plan: Extubation in OR  Informed Consent: I have reviewed the patients History and Physical, chart, labs and discussed the procedure including the risks, benefits and alternatives for the proposed anesthesia with the patient or authorized representative who has indicated his/her understanding and acceptance.   Dental advisory given  Plan Discussed with: CRNA  Anesthesia Plan Comments:        Anesthesia Quick Evaluation

## 2018-06-16 NOTE — H&P (Signed)
Justin Fletcher is an 17 y.o. male.   Chief Complaint: tonsillitis HPI: hx of recurrent tonsillitis and ready for surgery  Past Medical History:  Diagnosis Date  . Asthma   . Bronchitis   . Pneumonia   . Seasonal allergies   . Sleep apnea     Past Surgical History:  Procedure Laterality Date  . HAND SURGERY    . WISDOM TOOTH EXTRACTION      History reviewed. No pertinent family history. Social History:  reports that he has been smoking cigars. He has never used smokeless tobacco. He reports that he has current or past drug history. Drug: Marijuana. He reports that he does not drink alcohol.  Allergies: No Known Allergies  Medications Prior to Admission  Medication Sig Dispense Refill  . albuterol (PROVENTIL HFA;VENTOLIN HFA) 108 (90 Base) MCG/ACT inhaler Inhale 1-2 puffs into the lungs every 6 (six) hours as needed for wheezing or shortness of breath. 1 Inhaler 11  . fluticasone (FLONASE) 50 MCG/ACT nasal spray Place 2 sprays into both nostrils daily. (Patient taking differently: Place 2 sprays into both nostrils daily as needed for allergies. ) 16 g 12    No results found for this or any previous visit (from the past 48 hour(s)). No results found.  Review of Systems  Constitutional: Negative.   HENT: Negative.   Eyes: Negative.   Respiratory: Negative.   Cardiovascular: Negative.   Skin: Negative.     Blood pressure (!) 141/93, pulse 75, temperature 97.9 F (36.6 C), temperature source Oral, resp. rate 20, height 5\' 8"  (1.727 m), weight (!) 142.4 kg, SpO2 100 %. Physical Exam  Constitutional: He appears well-developed and well-nourished.  HENT:  Head: Normocephalic and atraumatic.  Nose: Nose normal.  Eyes: Pupils are equal, round, and reactive to light. Conjunctivae are normal.  Neck: Normal range of motion. Neck supple.  Cardiovascular: Normal rate.  Respiratory: Effort normal.  GI: Soft.  Musculoskeletal: Normal range of motion.     Assessment/Plan Chronic  tonsillitis- discussed tonsillectomy and ready to proceed  Suzanna Obey, MD 06/16/2018, 8:01 AM

## 2018-06-16 NOTE — Anesthesia Postprocedure Evaluation (Signed)
Anesthesia Post Note  Patient: Therapist, sports  Procedure(s) Performed: TONSILLECTOMY (Bilateral Throat)     Patient location during evaluation: PACU Anesthesia Type: General Level of consciousness: awake and alert Pain management: pain level controlled Vital Signs Assessment: post-procedure vital signs reviewed and stable Respiratory status: spontaneous breathing, nonlabored ventilation, respiratory function stable and patient connected to nasal cannula oxygen Cardiovascular status: blood pressure returned to baseline and stable Postop Assessment: no apparent nausea or vomiting Anesthetic complications: no    Last Vitals:  Vitals:   06/16/18 1035 06/16/18 1106  BP: (!) 115/52 124/64  Pulse: 65 75  Resp: 18 17  Temp:  36.8 C  SpO2: 98% 98%    Last Pain:  Vitals:   06/16/18 1106  TempSrc: Oral  PainSc:                  Justin Fletcher

## 2018-06-16 NOTE — Anesthesia Procedure Notes (Signed)
Procedure Name: Intubation Date/Time: 06/16/2018 8:25 AM Performed by: Keitha Butte, CRNA Pre-anesthesia Checklist: Patient identified, Emergency Drugs available, Suction available, Patient being monitored and Timeout performed Patient Re-evaluated:Patient Re-evaluated prior to induction Oxygen Delivery Method: Circle system utilized Preoxygenation: Pre-oxygenation with 100% oxygen Induction Type: IV induction Ventilation: Mask ventilation without difficulty Laryngoscope Size: Miller and 2 Grade View: Grade I Tube size: 7.5 mm Number of attempts: 1 Placement Confirmation: ETT inserted through vocal cords under direct vision,  positive ETCO2 and breath sounds checked- equal and bilateral Secured at: 24 cm Tube secured with: Tape Dental Injury: Teeth and Oropharynx as per pre-operative assessment

## 2018-06-16 NOTE — Progress Notes (Signed)
Reviewed previous EKG from 11/18.  Spoke with Dr. Hart Rochester about EKG concerns and that pt's chart was not sent to anesthesia for review.  Dr. Hart Rochester stated he was not concerned and to proceed without new EKG or drawing new blood work.

## 2018-06-16 NOTE — Transfer of Care (Signed)
Immediate Anesthesia Transfer of Care Note  Patient: Justin Fletcher  Procedure(s) Performed: TONSILLECTOMY (Bilateral Throat)  Patient Location: PACU  Anesthesia Type:General  Level of Consciousness: awake, alert  and oriented  Airway & Oxygen Therapy: Patient Spontanous Breathing and Patient connected to face mask oxygen  Post-op Assessment: Report given to RN and Post -op Vital signs reviewed and stable  Post vital signs: Reviewed and stable  Last Vitals:  Vitals Value Taken Time  BP    Temp    Pulse    Resp    SpO2      Last Pain:  Vitals:   06/16/18 0606  TempSrc:   PainSc: 0-No pain         Complications: No apparent anesthesia complications

## 2018-06-17 ENCOUNTER — Encounter (HOSPITAL_COMMUNITY): Payer: Self-pay | Admitting: Otolaryngology

## 2018-06-17 DIAGNOSIS — J3501 Chronic tonsillitis: Secondary | ICD-10-CM | POA: Diagnosis not present

## 2018-06-17 LAB — HIV ANTIBODY (ROUTINE TESTING W REFLEX): HIV SCREEN 4TH GENERATION: NONREACTIVE

## 2018-06-17 NOTE — Discharge Summary (Signed)
Physician Discharge Summary  Patient ID: Justin Fletcher MRN: [id number] DOB/AGE: 10-24-2000 17 y.o.  Admit date: 06/16/2018 Discharge date: 06/17/2018  Admission Diagnoses: Tonsillitis  Discharge Diagnoses:  Active Problems:   Tonsillitis   Discharged Condition: good  Hospital Course: 17 year old male with history of chronic tonsillitis presented for tonsillectomy.  See operative note.  He was observed overnight and did well.  He was felt stable for discharge on POD 1.  Consults: None  Significant Diagnostic Studies: None  Treatments: surgery: Tonsillectomy  Discharge Exam: Blood pressure 101/70, pulse (!) 54, temperature 97.8 F (36.6 C), temperature source Oral, resp. rate 20, height 5\' 8"  (1.727 m), weight 133.8 kg, SpO2 100 %. General appearance: alert, cooperative and no distress Throat: No throat bleeding  Disposition: Discharge disposition: 01-Home or Self Care       Discharge Instructions    Call MD for:  difficulty breathing, headache or visual disturbances   Complete by:  As directed    Call MD for:  extreme fatigue   Complete by:  As directed    Call MD for:  hives   Complete by:  As directed    Call MD for:  persistant dizziness or light-headedness   Complete by:  As directed    Call MD for:  persistant nausea and vomiting   Complete by:  As directed    Call MD for:  redness, tenderness, or signs of infection (pain, swelling, redness, odor or green/yellow discharge around incision site)   Complete by:  As directed    Call MD for:  severe uncontrolled pain   Complete by:  As directed    Call MD for:  temperature >100.4   Complete by:  As directed    Diet - low sodium heart healthy   Complete by:  As directed    Diet - low sodium heart healthy   Complete by:  As directed    Discharge instructions   Complete by:  As directed    Call for follow up in 3 weeks. Take Motrin and Tylenol for pain then use narcotic only if needed for pain. Call if any  bleeding, breathing problems or not drinking fluids.   Discharge instructions   Complete by:  As directed    Drink plenty of fluids.  Eat soft foods as able.  Avoid strenuous activity.   Increase activity slowly   Complete by:  As directed    Increase activity slowly   Complete by:  As directed      Allergies as of 06/17/2018   No Known Allergies     Medication List    STOP taking these medications   fluticasone 50 MCG/ACT nasal spray Commonly known as:  FLONASE     TAKE these medications   albuterol 108 (90 Base) MCG/ACT inhaler Commonly known as:  PROVENTIL HFA;VENTOLIN HFA Inhale 1-2 puffs into the lungs every 6 (six) hours as needed for wheezing or shortness of breath.   oxyCODONE 5 MG/5ML solution Commonly known as:  ROXICODONE Take 5 mLs (5 mg total) by mouth every 4 (four) hours as needed for up to 5 days for severe pain.      Follow-up Information    Suzanna Obey, MD. Schedule an appointment as soon as possible for a visit in 1 month(s).   Specialty:  Otolaryngology Contact information: [address] [place] [address] [phone]           Signed: Christia Reading 06/17/2018, 7:51 AM

## 2018-06-17 NOTE — Plan of Care (Signed)
  Problem: Pain Managment: Goal: General experience of comfort will improve Outcome: Progressing   Problem: Safety: Goal: Ability to remain free from injury will improve Outcome: Progressing   Problem: Skin Integrity: Goal: Risk for impaired skin integrity will decrease Outcome: Progressing   

## 2018-07-22 ENCOUNTER — Other Ambulatory Visit: Payer: Self-pay

## 2018-07-22 ENCOUNTER — Emergency Department (HOSPITAL_COMMUNITY)
Admission: EM | Admit: 2018-07-22 | Discharge: 2018-07-22 | Disposition: A | Discharge status: Home / Self Care | Payer: Medicaid Other | Attending: Emergency Medicine | Admitting: Emergency Medicine

## 2018-07-22 ENCOUNTER — Emergency Department (HOSPITAL_COMMUNITY): Payer: Medicaid Other

## 2018-07-22 ENCOUNTER — Encounter (HOSPITAL_COMMUNITY): Payer: Self-pay | Admitting: Emergency Medicine

## 2018-07-22 DIAGNOSIS — B349 Viral infection, unspecified: Secondary | ICD-10-CM | POA: Diagnosis not present

## 2018-07-22 DIAGNOSIS — F17210 Nicotine dependence, cigarettes, uncomplicated: Secondary | ICD-10-CM | POA: Diagnosis not present

## 2018-07-22 DIAGNOSIS — R112 Nausea with vomiting, unspecified: Secondary | ICD-10-CM | POA: Diagnosis present

## 2018-07-22 DIAGNOSIS — Z79899 Other long term (current) drug therapy: Secondary | ICD-10-CM | POA: Insufficient documentation

## 2018-07-22 DIAGNOSIS — R05 Cough: Secondary | ICD-10-CM | POA: Insufficient documentation

## 2018-07-22 DIAGNOSIS — R059 Cough, unspecified: Secondary | ICD-10-CM

## 2018-07-22 DIAGNOSIS — J45909 Unspecified asthma, uncomplicated: Secondary | ICD-10-CM | POA: Diagnosis not present

## 2018-07-22 DIAGNOSIS — R110 Nausea: Secondary | ICD-10-CM

## 2018-07-22 LAB — CBC WITH DIFFERENTIAL/PLATELET
Abs Immature Granulocytes: 0.04 10*3/uL (ref 0.00–0.07)
Basophils Absolute: 0.1 10*3/uL (ref 0.0–0.1)
Basophils Relative: 1 %
Eosinophils Absolute: 0 10*3/uL (ref 0.0–0.5)
Eosinophils Relative: 0 %
HCT: 44.8 % (ref 39.0–52.0)
Hemoglobin: 14.6 g/dL (ref 13.0–17.0)
Immature Granulocytes: 1 %
Lymphocytes Relative: 62 %
Lymphs Abs: 4.8 10*3/uL — ABNORMAL HIGH (ref 0.7–4.0)
MCH: 25.7 pg — ABNORMAL LOW (ref 26.0–34.0)
MCHC: 32.6 g/dL (ref 30.0–36.0)
MCV: 78.9 fL — ABNORMAL LOW (ref 80.0–100.0)
Monocytes Absolute: 0.6 10*3/uL (ref 0.1–1.0)
Monocytes Relative: 7 %
Neutro Abs: 2.2 10*3/uL (ref 1.7–7.7)
Neutrophils Relative %: 29 %
Platelets: 203 10*3/uL (ref 150–400)
RBC: 5.68 MIL/uL (ref 4.22–5.81)
RDW: 13.9 % (ref 11.5–15.5)
WBC Morphology: >10
WBC: 7.7 10*3/uL (ref 4.0–10.5)
nRBC: 0 % (ref 0.0–0.2)

## 2018-07-22 LAB — URINALYSIS, ROUTINE W REFLEX MICROSCOPIC
Bacteria, UA: NONE SEEN
Bilirubin Urine: NEGATIVE
Glucose, UA: NEGATIVE mg/dL
Ketones, ur: NEGATIVE mg/dL
Leukocytes, UA: NEGATIVE
Nitrite: NEGATIVE
Protein, ur: NEGATIVE mg/dL
Specific Gravity, Urine: 1.014 (ref 1.005–1.030)
pH: 6 (ref 5.0–8.0)

## 2018-07-22 LAB — COMPREHENSIVE METABOLIC PANEL
ALT: 54 U/L — ABNORMAL HIGH (ref 0–44)
AST: 44 U/L — ABNORMAL HIGH (ref 15–41)
Albumin: 3.1 g/dL — ABNORMAL LOW (ref 3.5–5.0)
Alkaline Phosphatase: 50 U/L (ref 38–126)
Anion gap: 7 (ref 5–15)
BUN: 5 mg/dL — ABNORMAL LOW (ref 6–20)
CO2: 27 mmol/L (ref 22–32)
Calcium: 8.9 mg/dL (ref 8.9–10.3)
Chloride: 103 mmol/L (ref 98–111)
Creatinine, Ser: 1.05 mg/dL (ref 0.61–1.24)
GFR calc Af Amer: >60 mL/min (ref >60)
GFR calc non Af Amer: >60 mL/min (ref >60)
Glucose, Bld: 92 mg/dL (ref 70–99)
Potassium: 3.6 mmol/L (ref 3.5–5.1)
Sodium: 137 mmol/L (ref 135–145)
Total Bilirubin: 0.6 mg/dL (ref 0.3–1.2)
Total Protein: 6.8 g/dL (ref 6.5–8.1)

## 2018-07-22 LAB — I-STAT TROPONIN, ED: Troponin i, poc: 0 ng/mL (ref 0.00–0.08)

## 2018-07-22 MED ORDER — IBUPROFEN 600 MG PO TABS: 600.0000 mg | ORAL_TABLET | Freq: Four times a day (QID) | ORAL | 0 refills | Status: DC | PRN

## 2018-07-22 MED ORDER — KETOROLAC TROMETHAMINE 30 MG/ML IJ SOLN
30.0000 mg | Freq: Once | INTRAMUSCULAR | Status: AC
  Administered 2018-07-22: 30 mg via INTRAVENOUS
  Filled 2018-07-22: qty 1

## 2018-07-22 MED ORDER — ACETAMINOPHEN 500 MG PO TABS: 500.0000 mg | ORAL_TABLET | Freq: Four times a day (QID) | ORAL | 0 refills | Status: DC | PRN

## 2018-07-22 MED ORDER — ONDANSETRON HCL 4 MG/2ML IJ SOLN
4.0000 mg | Freq: Once | INTRAMUSCULAR | Status: AC
  Administered 2018-07-22: 4 mg via INTRAVENOUS
  Filled 2018-07-22: qty 2

## 2018-07-22 MED ORDER — SALINE SPRAY 0.65 % NA SOLN: 1.0000 | NASAL | 0 refills | Status: DC | PRN

## 2018-07-22 MED ORDER — BENZONATATE 100 MG PO CAPS: 100.0000 mg | ORAL_CAPSULE | Freq: Three times a day (TID) | ORAL | 0 refills | Status: DC

## 2018-07-22 MED ORDER — ALBUTEROL SULFATE HFA 108 (90 BASE) MCG/ACT IN AERS: 1.0000 | INHALATION_SPRAY | Freq: Four times a day (QID) | RESPIRATORY_TRACT | 0 refills | Status: DC | PRN

## 2018-07-22 MED ORDER — SODIUM CHLORIDE 0.9 % IV BOLUS
1000.0000 mL | Freq: Once | INTRAVENOUS | Status: AC
  Administered 2018-07-22: 1000 mL via INTRAVENOUS

## 2018-07-22 MED ORDER — ONDANSETRON HCL 4 MG PO TABS: 4.0000 mg | ORAL_TABLET | Freq: Four times a day (QID) | ORAL | 0 refills | Status: DC

## 2018-07-22 NOTE — ED Notes (Signed)
Patient transported to X-ray 

## 2018-07-22 NOTE — ED Notes (Signed)
Family at bedside. 

## 2018-07-22 NOTE — ED Notes (Signed)
ED Provider/PA at bedside. 

## 2018-07-22 NOTE — ED Triage Notes (Signed)
Patient has been nauseated and vomiting since last night.  He denies any diarrhea and fever.  Visitor said she has been sick and probably got sick from her.  He said he took some ibuprofen earlier.

## 2018-07-22 NOTE — Discharge Instructions (Signed)
Take Tessalon every 8 hours as needed for cough.  Take Zofran every 6 hours as needed for nausea or vomiting.  Alternate ibuprofen and Tylenol as needed for fever or headache.  Use nasal saline as needed for nasal congestion and dryness.  Please return to emergency department if you develop any new or worsening symptoms, including localized abdominal pain, intractable vomiting, severe shortness of breath or chest pain, or any other concerning symptoms.

## 2018-07-22 NOTE — ED Notes (Signed)
Patient is alert and orientedx4.  Patient was explained discharge instructions and they understood them with no questions.  The patient's girlfriend, Babette Relic is taking patient home.

## 2018-07-22 NOTE — ED Provider Notes (Signed)
[facility] EMERGENCY DEPARTMENT Provider Note   CSN: [id number] Arrival date & time: 07/22/18  1907     History   Chief Complaint Chief Complaint  Patient presents with  . Nausea  . Emesis    HPI Justin Fletcher is a 17 y.o. male with history of asthma who presents with a 1 day history of cough, nausea, and vomiting. He has had 3 episodes of emesis. He has had some associated chest pain. He took ibuprofen PTA. He denies any abdominal pain, diarrhea, bloody stools. He has had some intermittent L ear pain. He denies sore throat. Patient had tonsillectomy 1 month ago on 06/16/18 and has had no problems since. He has had a sick contact with similar symptoms at home.  HPI  Past Medical History:  Diagnosis Date  . Asthma   . CAP (community acquired pneumonia) 01/2017  . Seasonal allergies   . Sleep apnea    "was told I have it; never tested for it" (06/16/2018)    Patient Active Problem List   Diagnosis Date Noted  . Tonsillitis 06/16/2018    Past Surgical History:  Procedure Laterality Date  . CLOSED REDUCTION HAND FRACTURE Right 2015   "had screws put in"  . FRACTURE SURGERY    . TONSILLECTOMY Bilateral 06/16/2018  . TONSILLECTOMY Bilateral 06/16/2018   Procedure: TONSILLECTOMY;  Surgeon: Suzanna Obey, MD;  Location: [facility] OR;  Service: ENT;  Laterality: Bilateral;  . WISDOM TOOTH EXTRACTION          Home Medications    Prior to Admission medications   Medication Sig Start Date End Date Taking? Authorizing Provider  acetaminophen (TYLENOL) 500 MG tablet Take 1 tablet (500 mg total) by mouth every 6 (six) hours as needed. 07/22/18   Wise Fees, Waylan Boga, PA-C  albuterol (PROVENTIL HFA;VENTOLIN HFA) 108 (90 Base) MCG/ACT inhaler Inhale 1-2 puffs into the lungs every 6 (six) hours as needed for wheezing or shortness of breath. 01/23/18   Elvina Sidle, MD  albuterol (PROVENTIL HFA;VENTOLIN HFA) 108 (90 Base) MCG/ACT inhaler Inhale 1-2 puffs into the lungs every  6 (six) hours as needed for wheezing or shortness of breath. 07/22/18   Reinette Cuneo, Waylan Boga, PA-C  benzonatate (TESSALON) 100 MG capsule Take 1 capsule (100 mg total) by mouth every 8 (eight) hours. 07/22/18   Analyce Tavares, Waylan Boga, PA-C  ibuprofen (ADVIL,MOTRIN) 600 MG tablet Take 1 tablet (600 mg total) by mouth every 6 (six) hours as needed. 07/22/18   Kason Benak, Waylan Boga, PA-C  ondansetron (ZOFRAN) 4 MG tablet Take 1 tablet (4 mg total) by mouth every 6 (six) hours. 07/22/18   Jenelle Drennon, Waylan Boga, PA-C  sodium chloride (OCEAN) 0.65 % SOLN nasal spray Place 1 spray into both nostrils as needed for congestion. 07/22/18   Emi Holes, PA-C    Family History History reviewed. No pertinent family history.  Social History Social History   Tobacco Use  . Smoking status: Current Every Day Smoker    Years: 0.70    Types: Cigars  . Smokeless tobacco: Never Used  . Tobacco comment: 06/16/2018 "3-4 day Black and Milds"  Substance Use Topics  . Alcohol use: Never    Frequency: Never  . Drug use: Yes    Types: Marijuana    Comment: 06/16/2018 "nothing in ~ 1 month"     Allergies   Patient has no known allergies.   Review of Systems Review of Systems  Constitutional: Positive for chills. Negative for fever.  HENT: Positive  for ear pain. Negative for facial swelling and sore throat.   Respiratory: Positive for cough. Negative for shortness of breath.   Cardiovascular: Positive for chest pain.  Gastrointestinal: Positive for nausea and vomiting. Negative for abdominal pain, blood in stool and diarrhea.  Genitourinary: Negative for dysuria.  Musculoskeletal: Negative for back pain.  Skin: Negative for rash and wound.  Neurological: Negative for headaches.  Psychiatric/Behavioral: The patient is not nervous/anxious.      Physical Exam Updated Vital Signs BP 131/75   Pulse 95   Temp 98.8 F (37.1 C) (Oral)   Resp (!) 22   SpO2 98%   Physical Exam  Constitutional: He appears well-developed  and well-nourished. No distress.  HENT:  Head: Normocephalic and atraumatic.  Right Ear: Tympanic membrane normal.  Left Ear: Tympanic membrane normal. Tympanic membrane is not bulging.  Mouth/Throat: Posterior oropharyngeal erythema present. No oropharyngeal exudate. Tonsils are 0 on the right. Tonsils are 0 on the left. No tonsillar exudate.  Eyes: Pupils are equal, round, and reactive to light. Conjunctivae are normal. Right eye exhibits no discharge. Left eye exhibits no discharge. No scleral icterus.  Neck: Normal range of motion. Neck supple. No thyromegaly present.  Cardiovascular: Normal rate, regular rhythm, normal heart sounds and intact distal pulses. Exam reveals no gallop and no friction rub.  No murmur heard. Pulmonary/Chest: Effort normal and breath sounds normal. No stridor. No respiratory distress. He has no wheezes. He has no rales.  Abdominal: Soft. Bowel sounds are normal. He exhibits no distension. There is no tenderness. There is no rebound and no guarding.  Musculoskeletal: He exhibits no edema.  Lymphadenopathy:    He has no cervical adenopathy.  Neurological: He is alert. Coordination normal.  Skin: Skin is warm and dry. No rash noted. He is not diaphoretic. No pallor.  Psychiatric: He has a normal mood and affect.  Nursing note and vitals reviewed.    ED Treatments / Results  Labs (all labs ordered are listed, but only abnormal results are displayed) Labs Reviewed  COMPREHENSIVE METABOLIC PANEL - Abnormal; Notable for the following components:      Result Value   BUN 5 (*)    Albumin 3.1 (*)    AST 44 (*)    ALT 54 (*)    All other components within normal limits  CBC WITH DIFFERENTIAL/PLATELET - Abnormal; Notable for the following components:   MCV 78.9 (*)    MCH 25.7 (*)    Lymphs Abs 4.8 (*)    All other components within normal limits  URINALYSIS, ROUTINE W REFLEX MICROSCOPIC - Abnormal; Notable for the following components:   Hgb urine dipstick  SMALL (*)    All other components within normal limits  I-STAT TROPONIN, ED    EKG None  Radiology Dg Chest 2 View  Result Date: 07/22/2018 CLINICAL DATA:  Chest pain and cough EXAM: CHEST - 2 VIEW COMPARISON:  10/19/2017 FINDINGS: The heart size and mediastinal contours are within normal limits. Both lungs are clear. The visualized skeletal structures are unremarkable. IMPRESSION: Clear lungs. Electronically Signed   By: Deatra Robinson M.D.   On: 07/22/2018 19:51    Procedures Procedures (including critical care time)  Medications Ordered in ED Medications  ketorolac (TORADOL) 30 MG/ML injection 30 mg (has no administration in time range)  sodium chloride 0.9 % bolus 1,000 mL (1,000 mLs Intravenous New Bag/Given 07/22/18 2005)  ondansetron (ZOFRAN) injection 4 mg (4 mg Intravenous Given 07/22/18 2006)     Initial  Impression / Assessment and Plan / ED Course  I have reviewed the triage vital signs and the nursing notes.  Pertinent labs & imaging results that were available during my care of the patient were reviewed by me and considered in my medical decision making (see chart for details).     Patient presenting with cough, nasal congestion, nausea, vomiting.  Suspect viral syndrome.  Labs are unremarkable except for mildly elevated transaminases, most likely from vomiting or viral syndrome.  Chest x-ray is clear.  Patient feeling much better after Zofran and fluids in the ED.  He is tolerating oral fluids.  Will discharge home with supportive treatment.  Return precautions discussed.  Patient understands and agrees with plan.  Patient vitals stable throughout ED course and discharged in satisfactory condition.  Final Clinical Impressions(s) / ED Diagnoses   Final diagnoses:  Viral syndrome  Cough  Nausea    ED Discharge Orders         Ordered    albuterol (PROVENTIL HFA;VENTOLIN HFA) 108 (90 Base) MCG/ACT inhaler  Every 6 hours PRN     07/22/18 2055    ondansetron  (ZOFRAN) 4 MG tablet  Every 6 hours     07/22/18 2055    benzonatate (TESSALON) 100 MG capsule  Every 8 hours     07/22/18 2055    sodium chloride (OCEAN) 0.65 % SOLN nasal spray  As needed     07/22/18 2055    ibuprofen (ADVIL,MOTRIN) 600 MG tablet  Every 6 hours PRN     07/22/18 2055    acetaminophen (TYLENOL) 500 MG tablet  Every 6 hours PRN     07/22/18 2055           Emi Holes, PA-C 07/22/18 2133    Margarita Grizzle, MD 07/22/18 2203

## 2018-09-05 ENCOUNTER — Emergency Department (HOSPITAL_COMMUNITY)
Admission: EM | Admit: 2018-09-05 | Discharge: 2018-09-05 | Discharge status: Against Medical Advice | Payer: Medicaid Other | Attending: Emergency Medicine | Admitting: Emergency Medicine

## 2018-09-05 ENCOUNTER — Other Ambulatory Visit: Payer: Self-pay

## 2018-09-05 ENCOUNTER — Emergency Department (HOSPITAL_COMMUNITY): Payer: Medicaid Other

## 2018-09-05 ENCOUNTER — Encounter (HOSPITAL_COMMUNITY): Payer: Self-pay | Admitting: Emergency Medicine

## 2018-09-05 DIAGNOSIS — R0602 Shortness of breath: Secondary | ICD-10-CM | POA: Diagnosis not present

## 2018-09-05 DIAGNOSIS — Z5321 Procedure and treatment not carried out due to patient leaving prior to being seen by health care provider: Secondary | ICD-10-CM | POA: Diagnosis not present

## 2018-09-05 DIAGNOSIS — R05 Cough: Secondary | ICD-10-CM | POA: Insufficient documentation

## 2018-09-05 DIAGNOSIS — J3489 Other specified disorders of nose and nasal sinuses: Secondary | ICD-10-CM | POA: Diagnosis not present

## 2018-09-05 DIAGNOSIS — R0789 Other chest pain: Secondary | ICD-10-CM | POA: Diagnosis not present

## 2018-09-05 NOTE — ED Notes (Signed)
No answer when called for vitals x1.

## 2018-09-05 NOTE — ED Notes (Signed)
No answer when called for vitals x2 

## 2018-09-05 NOTE — ED Triage Notes (Signed)
C/o productive cough with yellow phlegm, sob, and nasal drainage since Friday.  Denies sore throat and fever.  Reports aching across chest.

## 2018-09-05 NOTE — ED Notes (Signed)
No answer for vitals x 3  

## 2018-10-28 ENCOUNTER — Other Ambulatory Visit: Payer: Self-pay

## 2018-10-28 ENCOUNTER — Encounter (HOSPITAL_COMMUNITY): Payer: Self-pay | Admitting: Emergency Medicine

## 2018-10-28 ENCOUNTER — Emergency Department (HOSPITAL_COMMUNITY)
Admission: EM | Admit: 2018-10-28 | Discharge: 2018-10-28 | Disposition: A | Discharge status: Home / Self Care | Payer: Medicaid Other | Attending: Emergency Medicine | Admitting: Emergency Medicine

## 2018-10-28 DIAGNOSIS — W25XXXA Contact with sharp glass, initial encounter: Secondary | ICD-10-CM | POA: Diagnosis not present

## 2018-10-28 DIAGNOSIS — Z23 Encounter for immunization: Secondary | ICD-10-CM | POA: Insufficient documentation

## 2018-10-28 DIAGNOSIS — Y999 Unspecified external cause status: Secondary | ICD-10-CM | POA: Diagnosis not present

## 2018-10-28 DIAGNOSIS — F17290 Nicotine dependence, other tobacco product, uncomplicated: Secondary | ICD-10-CM | POA: Diagnosis not present

## 2018-10-28 DIAGNOSIS — Y939 Activity, unspecified: Secondary | ICD-10-CM | POA: Insufficient documentation

## 2018-10-28 DIAGNOSIS — S61212A Laceration without foreign body of right middle finger without damage to nail, initial encounter: Secondary | ICD-10-CM | POA: Diagnosis not present

## 2018-10-28 DIAGNOSIS — F1290 Cannabis use, unspecified, uncomplicated: Secondary | ICD-10-CM | POA: Insufficient documentation

## 2018-10-28 DIAGNOSIS — Y929 Unspecified place or not applicable: Secondary | ICD-10-CM | POA: Insufficient documentation

## 2018-10-28 MED ORDER — TETANUS-DIPHTH-ACELL PERTUSSIS 5-2.5-18.5 LF-MCG/0.5 IM SUSP
0.5000 mL | Freq: Once | INTRAMUSCULAR | Status: AC
  Administered 2018-10-28: 0.5 mL via INTRAMUSCULAR
  Filled 2018-10-28: qty 0.5

## 2018-10-28 NOTE — ED Provider Notes (Signed)
[facility] EMERGENCY DEPARTMENT Provider Note   CSN: [id number] Arrival date & time: 10/28/18  0022     History   Chief Complaint Chief Complaint  Patient presents with  . Laceration    HPI Justin Fletcher is a 18 y.o. male.  Patient presents to the emergency department with a chief complaint of finger laceration.  States that the wine glass that he was holding broke and punctured his right middle finger.  Bleeding controlled prior to arrival.  Last tetanus shot is unknown.  Denies any other associated symptoms.  Has not taken anything for the pain.  Denies numbness, weakness, or tingling.  The history is provided by the patient. No language interpreter was used.    Past Medical History:  Diagnosis Date  . Asthma   . CAP (community acquired pneumonia) 01/2017  . Seasonal allergies   . Sleep apnea    "was told I have it; never tested for it" (06/16/2018)    Patient Active Problem List   Diagnosis Date Noted  . Tonsillitis 06/16/2018    Past Surgical History:  Procedure Laterality Date  . CLOSED REDUCTION HAND FRACTURE Right 2015   "had screws put in"  . FRACTURE SURGERY    . TONSILLECTOMY Bilateral 06/16/2018  . TONSILLECTOMY Bilateral 06/16/2018   Procedure: TONSILLECTOMY;  Surgeon: Suzanna Obey, MD;  Location: [facility] OR;  Service: ENT;  Laterality: Bilateral;  . WISDOM TOOTH EXTRACTION          Home Medications    Prior to Admission medications   Medication Sig Start Date End Date Taking? Authorizing Provider  acetaminophen (TYLENOL) 500 MG tablet Take 1 tablet (500 mg total) by mouth every 6 (six) hours as needed. 07/22/18   Law, Waylan Boga, PA-C  albuterol (PROVENTIL HFA;VENTOLIN HFA) 108 (90 Base) MCG/ACT inhaler Inhale 1-2 puffs into the lungs every 6 (six) hours as needed for wheezing or shortness of breath. 01/23/18   Elvina Sidle, MD  albuterol (PROVENTIL HFA;VENTOLIN HFA) 108 (90 Base) MCG/ACT inhaler Inhale 1-2 puffs into the lungs  every 6 (six) hours as needed for wheezing or shortness of breath. 07/22/18   Law, Waylan Boga, PA-C  benzonatate (TESSALON) 100 MG capsule Take 1 capsule (100 mg total) by mouth every 8 (eight) hours. 07/22/18   Law, Waylan Boga, PA-C  ibuprofen (ADVIL,MOTRIN) 600 MG tablet Take 1 tablet (600 mg total) by mouth every 6 (six) hours as needed. 07/22/18   Law, Waylan Boga, PA-C  ondansetron (ZOFRAN) 4 MG tablet Take 1 tablet (4 mg total) by mouth every 6 (six) hours. 07/22/18   Law, Waylan Boga, PA-C  sodium chloride (OCEAN) 0.65 % SOLN nasal spray Place 1 spray into both nostrils as needed for congestion. 07/22/18   Emi Holes, PA-C    Family History No family history on file.  Social History Social History   Tobacco Use  . Smoking status: Current Every Day Smoker    Years: 0.70    Types: Cigars  . Smokeless tobacco: Never Used  . Tobacco comment: 06/16/2018 "3-4 day Black and Milds"  Substance Use Topics  . Alcohol use: Never    Frequency: Never  . Drug use: Yes    Types: Marijuana     Allergies   Patient has no known allergies.   Review of Systems Review of Systems  All other systems reviewed and are negative.    Physical Exam Updated Vital Signs BP 132/81 (BP Location: Right Arm)   Pulse 82  Temp 98.3 F (36.8 C) (Oral)   Resp 18   SpO2 97%   Physical Exam Vitals signs and nursing note reviewed.  Constitutional:      Appearance: He is well-developed.  HENT:     Head: Normocephalic and atraumatic.  Eyes:     Conjunctiva/sclera: Conjunctivae normal.  Neck:     Musculoskeletal: Normal range of motion.  Cardiovascular:     Rate and Rhythm: Normal rate.  Pulmonary:     Effort: Pulmonary effort is normal.  Abdominal:     General: There is no distension.  Musculoskeletal: Normal range of motion.  Skin:    General: Skin is dry.     Comments: 1 cm laceration to the right middle finger at the middle phalanx on the palmar aspect, no foreign body, no deep  structures visualized or thought to be involved  Neurological:     Mental Status: He is alert and oriented to person, place, and time.  Psychiatric:        Behavior: Behavior normal.        Thought Content: Thought content normal.        Judgment: Judgment normal.      ED Treatments / Results  Labs (all labs ordered are listed, but only abnormal results are displayed) Labs Reviewed - No data to display  EKG None  Radiology No results found.  Procedures Procedures (including critical care time) LACERATION REPAIR Performed by: Roxy Horseman Authorized by: Roxy Horseman Consent: Verbal consent obtained. Risks and benefits: risks, benefits and alternatives were discussed Consent given by: patient Patient identity confirmed: provided demographic data Prepped and Draped in normal sterile fashion Wound explored  Laceration Location: right middle finger  Laceration Length: 1cm  No Foreign Bodies seen or palpated  Anesthesia: none  Local anesthetic: none  Anesthetic total: none  Irrigation method: syringe Amount of cleaning: standard  Skin closure: dermabond  Number of sutures: dermabond  Technique: dermabond  Patient tolerance: Patient tolerated the procedure well with no immediate complications.  Medications Ordered in ED Medications - No data to display   Initial Impression / Assessment and Plan / ED Course  I have reviewed the triage vital signs and the nursing notes.  Pertinent labs & imaging results that were available during my care of the patient were reviewed by me and considered in my medical decision making (see chart for details).     Patient with simple finger laceration.  This can easily be repaired with Dermabond.  I do not believe he has any deeper structures involved.  No palpable foreign body.  The wound base is visualized.  Final Clinical Impressions(s) / ED Diagnoses   Final diagnoses:  Laceration of right middle finger without  foreign body without damage to nail, initial encounter    ED Discharge Orders    None       Roxy Horseman, PA-C 10/28/18 0557    Zadie Rhine, MD 10/28/18 [phone]

## 2018-10-28 NOTE — ED Notes (Signed)
E-signature not available, verbalized understanding of DC instructions. 

## 2018-10-28 NOTE — ED Triage Notes (Signed)
Pt was squeezing a wine glass and it broke in his hand. Laceration to right middle finger. Bleeding controlled.

## 2018-12-27 IMAGING — CR DG CHEST 2V
2 series · 2 of 2 positions shown · non-contrast
Comparison: Chest radiograph performed 07/22/2018

CLINICAL DATA: Acute onset of right-sided chest pain and shortness
of breath.

EXAM:
CHEST - 2 VIEW

[chest pa]
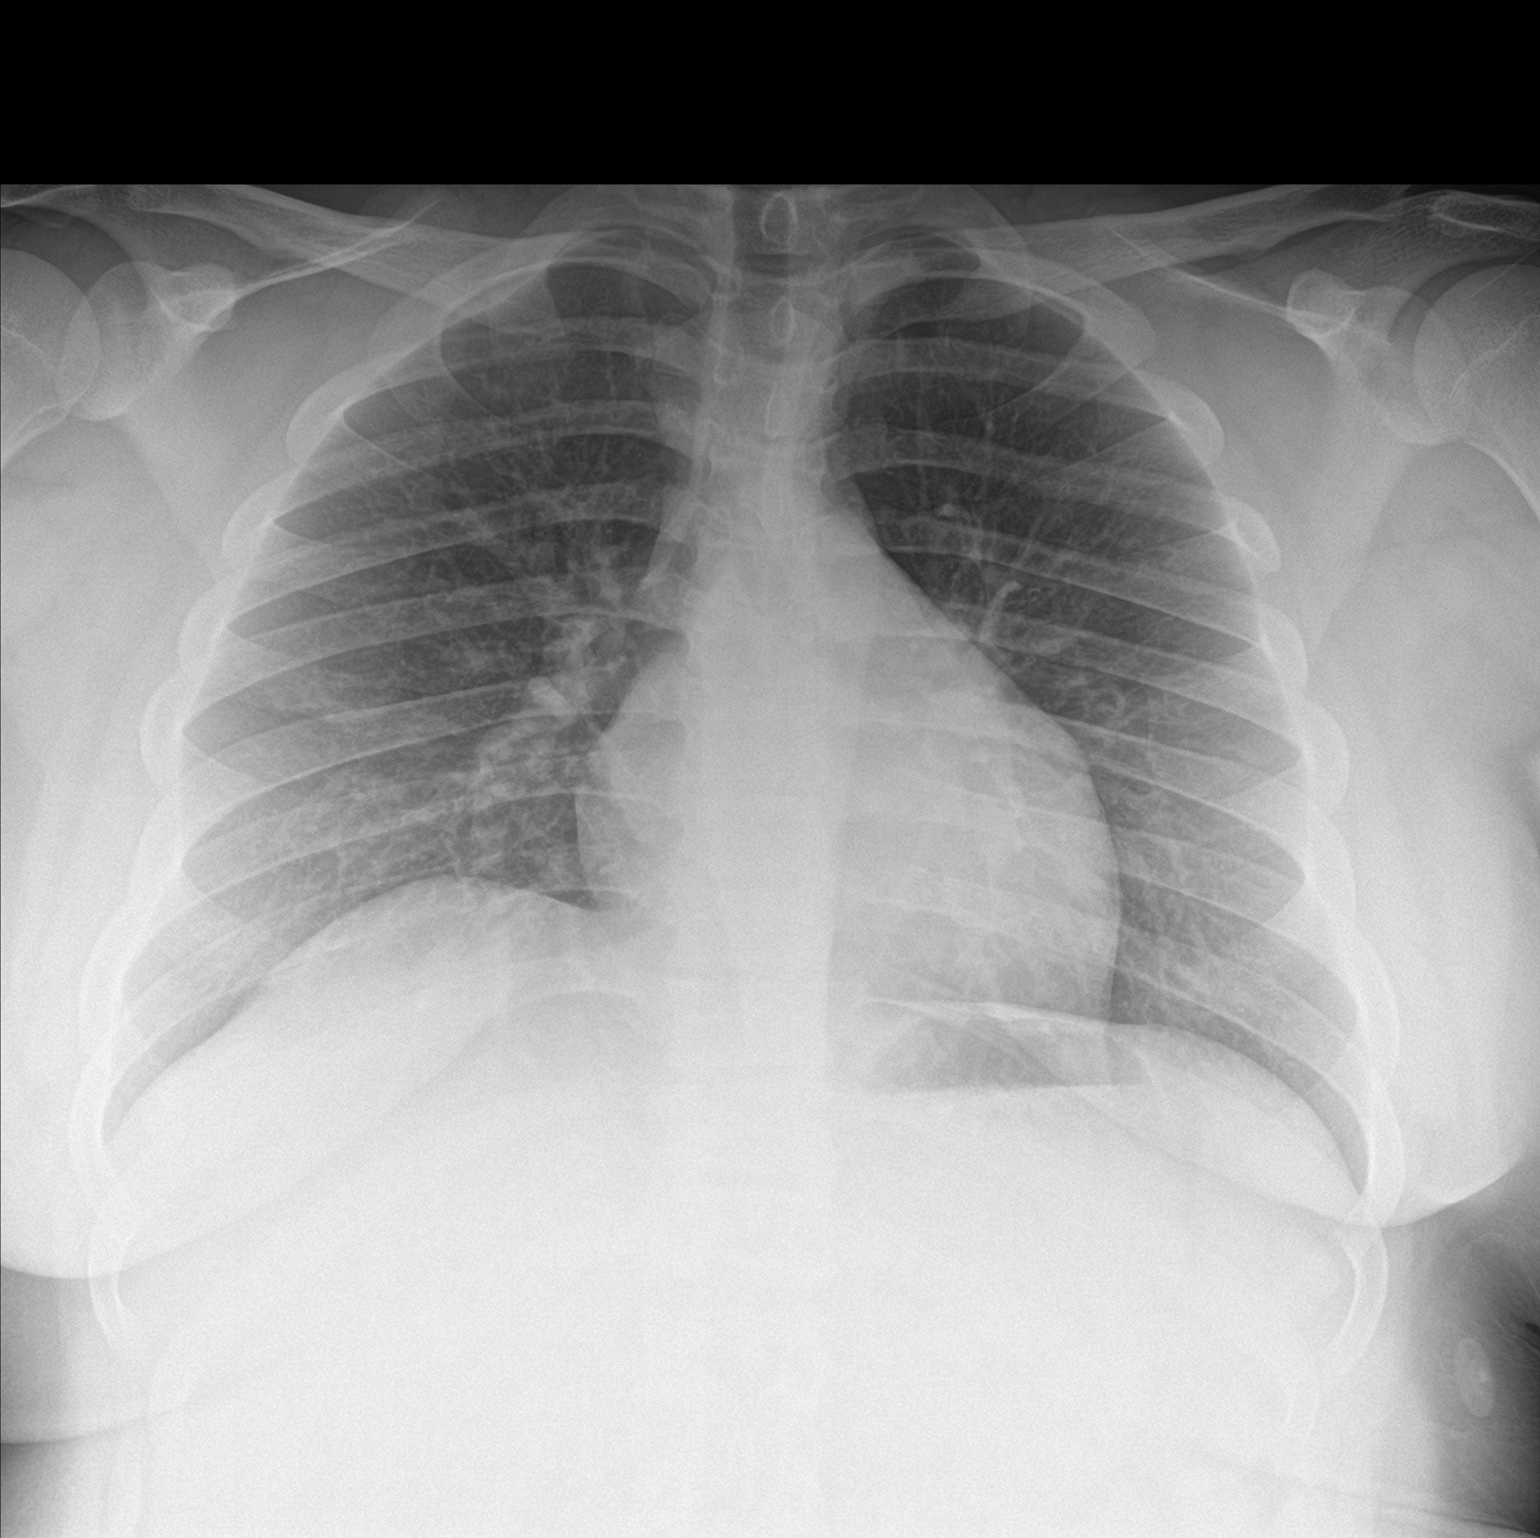

[chest lat]
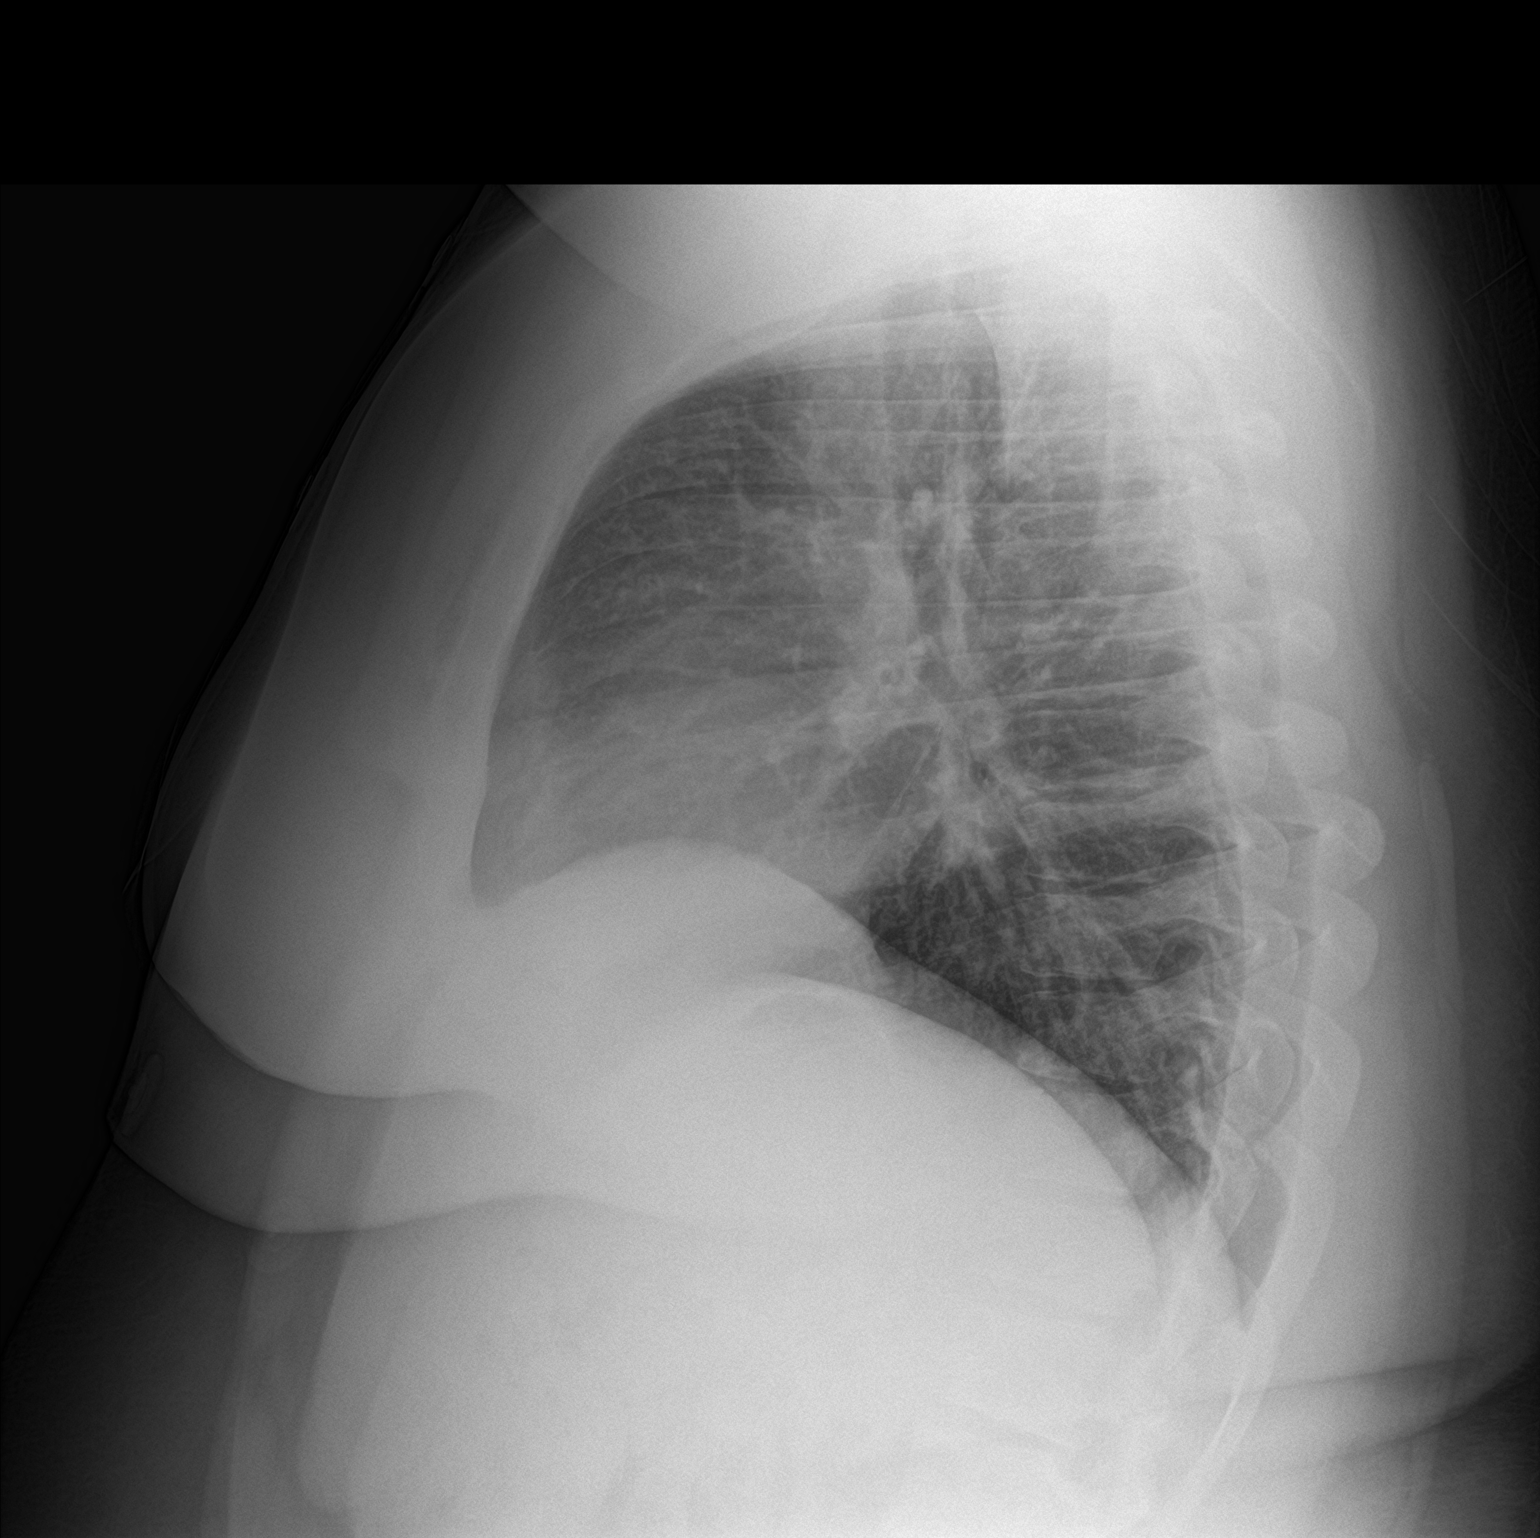

[2 of 2 positions shown; findings below may reference images not displayed]

FINDINGS: The lungs are well-aerated and clear. There is no evidence of focal
opacification, pleural effusion or pneumothorax.

The heart is normal in size; the mediastinal contour is within
normal limits. No acute osseous abnormalities are seen.
IMPRESSION: No acute cardiopulmonary process seen.

## 2019-06-10 ENCOUNTER — Emergency Department (HOSPITAL_COMMUNITY): Payer: Medicaid Other

## 2019-06-10 ENCOUNTER — Other Ambulatory Visit (HOSPITAL_COMMUNITY): Payer: Self-pay

## 2019-06-10 ENCOUNTER — Encounter (HOSPITAL_COMMUNITY): Payer: Self-pay | Admitting: Emergency Medicine

## 2019-06-10 ENCOUNTER — Emergency Department (HOSPITAL_COMMUNITY)
Admission: EM | Admit: 2019-06-10 | Discharge: 2019-06-10 | Disposition: A | Discharge status: Home / Self Care | Payer: Medicaid Other | Attending: Emergency Medicine | Admitting: Emergency Medicine

## 2019-06-10 ENCOUNTER — Other Ambulatory Visit: Payer: Self-pay

## 2019-06-10 DIAGNOSIS — S63292A Dislocation of distal interphalangeal joint of right middle finger, initial encounter: Secondary | ICD-10-CM | POA: Insufficient documentation

## 2019-06-10 DIAGNOSIS — Y999 Unspecified external cause status: Secondary | ICD-10-CM | POA: Insufficient documentation

## 2019-06-10 DIAGNOSIS — Y929 Unspecified place or not applicable: Secondary | ICD-10-CM | POA: Insufficient documentation

## 2019-06-10 DIAGNOSIS — J45909 Unspecified asthma, uncomplicated: Secondary | ICD-10-CM | POA: Insufficient documentation

## 2019-06-10 DIAGNOSIS — Y9389 Activity, other specified: Secondary | ICD-10-CM | POA: Insufficient documentation

## 2019-06-10 DIAGNOSIS — Z79899 Other long term (current) drug therapy: Secondary | ICD-10-CM | POA: Diagnosis not present

## 2019-06-10 DIAGNOSIS — S63299A Dislocation of distal interphalangeal joint of unspecified finger, initial encounter: Secondary | ICD-10-CM

## 2019-06-10 DIAGNOSIS — X58XXXA Exposure to other specified factors, initial encounter: Secondary | ICD-10-CM | POA: Diagnosis not present

## 2019-06-10 DIAGNOSIS — F17290 Nicotine dependence, other tobacco product, uncomplicated: Secondary | ICD-10-CM | POA: Diagnosis not present

## 2019-06-10 DIAGNOSIS — S60942A Unspecified superficial injury of right middle finger, initial encounter: Secondary | ICD-10-CM | POA: Diagnosis present

## 2019-06-10 MED ORDER — IBUPROFEN 800 MG PO TABS: 800.0000 mg | ORAL_TABLET | Freq: Three times a day (TID) | ORAL | 0 refills | Status: DC

## 2019-06-10 MED ORDER — IBUPROFEN 400 MG PO TABS
800.0000 mg | ORAL_TABLET | Freq: Once | ORAL | Status: AC
  Administered 2019-06-10: 800 mg via ORAL
  Filled 2019-06-10: qty 2

## 2019-06-10 NOTE — ED Provider Notes (Signed)
[facility] Provider Note   CSN: [id number] Arrival date & time: 06/10/19  0000     History   Chief Complaint Chief Complaint  Patient presents with   Finger Injury    HPI Justin Fletcher is a 18 y.o. male.     The history is provided by the patient and medical records.     18 y.o. M with hx of asthma, seasonal allergies, sleep apnea, presenting to the ED for right 4th digit pain.  States he was rough housing with his brother, his finger got jammed and now he cannot extend it out.  He is right hand dominant.  Past Medical History:  Diagnosis Date   Asthma    CAP (community acquired pneumonia) 01/2017   Seasonal allergies    Sleep apnea    "was told I have it; never tested for it" (06/16/2018)    Patient Active Problem List   Diagnosis Date Noted   Tonsillitis 06/16/2018    Past Surgical History:  Procedure Laterality Date   CLOSED REDUCTION HAND FRACTURE Right 2015   "had screws put in"   FRACTURE SURGERY     TONSILLECTOMY Bilateral 06/16/2018   TONSILLECTOMY Bilateral 06/16/2018   Procedure: TONSILLECTOMY;  Surgeon: Suzanna ObeyByers, John, MD;  [facility];  Service: ENT;  Laterality: Bilateral;   WISDOM TOOTH EXTRACTION          Home Medications    Prior to Admission medications   Medication Sig Start Date End Date Taking? Authorizing Provider  acetaminophen (TYLENOL) 500 MG tablet Take 1 tablet (500 mg total) by mouth every 6 (six) hours as needed. 07/22/18   Law, Waylan BogaAlexandra M, PA-C  albuterol (PROVENTIL HFA;VENTOLIN HFA) 108 (90 Base) MCG/ACT inhaler Inhale 1-2 puffs into the lungs every 6 (six) hours as needed for wheezing or shortness of breath. 01/23/18   Elvina SidleLauenstein, Kurt, MD  albuterol (PROVENTIL HFA;VENTOLIN HFA) 108 (90 Base) MCG/ACT inhaler Inhale 1-2 puffs into the lungs every 6 (six) hours as needed for wheezing or shortness of breath. 07/22/18   Law, Waylan BogaAlexandra M, PA-C  benzonatate (TESSALON) 100 MG capsule  Take 1 capsule (100 mg total) by mouth every 8 (eight) hours. 07/22/18   Law, Waylan BogaAlexandra M, PA-C  ibuprofen (ADVIL,MOTRIN) 600 MG tablet Take 1 tablet (600 mg total) by mouth every 6 (six) hours as needed. 07/22/18   Law, Waylan BogaAlexandra M, PA-C  ondansetron (ZOFRAN) 4 MG tablet Take 1 tablet (4 mg total) by mouth every 6 (six) hours. 07/22/18   Law, Waylan BogaAlexandra M, PA-C  sodium chloride (OCEAN) 0.65 % SOLN nasal spray Place 1 spray into both nostrils as needed for congestion. 07/22/18   Emi HolesLaw, Alexandra M, PA-C    Family History No family history on file.  Social History Social History   Tobacco Use   Smoking status: Current Every Day Smoker    Years: 0.70    Types: Cigars   Smokeless tobacco: Never Used   Tobacco comment: 06/16/2018 "3-4 day Black and Milds"  Substance Use Topics   Alcohol use: Never    Frequency: Never   Drug use: Yes    Types: Marijuana     Allergies   Patient has no known allergies.   Review of Systems Review of Systems  Musculoskeletal: Positive for arthralgias.  All other systems reviewed and are negative.    Physical Exam Updated Vital Signs BP 134/79 (BP Location: Left Arm)    Pulse 98    Temp 98.8 F (37.1 C) (Oral)  Resp 18    Ht 5\' 8"  (1.727 m)    Wt (!) 149 kg    SpO2 100%    BMI 49.95 kg/m   Physical Exam Vitals signs and nursing note reviewed.  Constitutional:      Appearance: He is well-developed.  HENT:     Head: Normocephalic and atraumatic.  Eyes:     Conjunctiva/sclera: Conjunctivae normal.     Pupils: Pupils are equal, round, and reactive to light.  Neck:     Musculoskeletal: Normal range of motion.  Cardiovascular:     Rate and Rhythm: Normal rate and regular rhythm.     Heart sounds: Normal heart sounds.  Pulmonary:     Effort: Pulmonary effort is normal.     Breath sounds: Normal breath sounds.  Abdominal:     General: Bowel sounds are normal.     Palpations: Abdomen is soft.  Musculoskeletal: Normal range of motion.      Comments: Right ring finger with dislocation at DIP joint, unable to full extend at this level but otherwise full ROM of affected digit, normal distal sensation and cap refill  Skin:    General: Skin is warm and dry.  Neurological:     Mental Status: He is alert and oriented to person, place, and time.      ED Treatments / Results  Labs (all labs ordered are listed, but only abnormal results are displayed) Labs Reviewed - No data to display  EKG None  Radiology Dg Finger Ring Right  Result Date: 06/10/2019 CLINICAL DATA:  Post reduction, jammed right finger, unable to straight EXAM: RIGHT RING FINGER 2+V COMPARISON:  Same-day radiograph. FINDINGS: Reduction of the previously seen fourth distal interphalangeal fracture dislocation, now in near anatomic alignment. Few radiodense fracture fragments are again seen about the base of the fifth distal phalanx. No additional fractures are seen. IMPRESSION: Successful reduction of the fourth distal interphalangeal volar dislocation seen on comparison imaging. Fracture fragments adjacent the base of the fifth distal phalanx. Electronically Signed   By: Lovena Le M.D.   On: 06/10/2019 02:52   Dg Finger Ring Right  Result Date: 06/10/2019 CLINICAL DATA:  Jammed fourth finger. Deformity. EXAM: RIGHT RING FINGER 2+V COMPARISON:  None. FINDINGS: Single lateral view of the ring finger demonstrates volar dislocation of the distal phalanx with respect to the proximal phalanx. Curvilinear fracture fragment at the joint space, donor site likely the middle phalanx. IMPRESSION: Volar dislocation of the distal phalanx at the distal interphalangeal joint. Curvilinear fracture fragment at the joint space, donor site likely the middle phalanx. Electronically Signed   By: Keith Rake M.D.   On: 06/10/2019 01:36    Procedures Reduction of dislocation  Date/Time: 06/10/2019 1:58 AM Performed by: Larene Pickett, PA-C Authorized by: Larene Pickett, PA-C    Consent: Verbal consent obtained. Risks and benefits: risks, benefits and alternatives were discussed Consent given by: patient Patient understanding: patient states understanding of the procedure being performed Imaging studies: imaging studies available Patient identity confirmed: verbally with patient Time out: Immediately prior to procedure a "time out" was called to verify the correct patient, procedure, equipment, support staff and site/side marked as required. Local anesthesia used: no  Anesthesia: Local anesthesia used: no  Sedation: Patient sedated: no  Patient tolerance: patient tolerated the procedure well with no immediate complications    (including critical care time)  Medications Ordered in ED Medications - No data to display   Initial Impression / Assessment and Plan /  ED Course  I have reviewed the triage vital signs and the nursing notes.  Pertinent labs & imaging results that were available during my care of the patient were reviewed by me and considered in my medical decision making (see chart for details).  18 y.o. M here with right ring finger injury while rough housing with brother.  Unable to extend finger at the DIP joint, otherwise hand is normal in appearance and neurovascularly intact.  X-ray with dislocation at the DIP joint which was successfully reduced here.  He does have a small fracture fragment.  Finger placed in static splint and will be given follow-up with hand surgery.  Rx motrin.  Return here for any new/acute changes.  Final Clinical Impressions(s) / ED Diagnoses   Final diagnoses:  Dislocation of distal interphalangeal (DIP) joint of finger, initial encounter    ED Discharge Orders         Ordered    ibuprofen (ADVIL) 800 MG tablet  3 times daily     06/10/19 0256           Garlon Hatchet, PA-C 06/10/19 0259    Nira Conn, MD 06/10/19 [phone]

## 2019-06-10 NOTE — Discharge Instructions (Signed)
Take the prescribed medication as directed.  Leave splint in place to allow healing. Follow-up with hand specialist-- call Monday morning for appt. Return to the ED for new or worsening symptoms.

## 2019-06-10 NOTE — ED Triage Notes (Signed)
Pt states "while playing around" he jammed R 4th finger, pt states he is unable to straiten finger.

## 2019-06-10 NOTE — ED Notes (Signed)
Portable xray at bedside.

## 2019-06-10 NOTE — ED Notes (Signed)
ED Provider at bedside. 

## 2019-10-01 IMAGING — CR DG FINGER RING 2+V*R*
1 series · 1 of 1 positions shown · non-contrast
Comparison: None.

CLINICAL DATA: Jammed fourth finger. Deformity.

EXAM:
RIGHT RING FINGER 2+V

[finger lat]
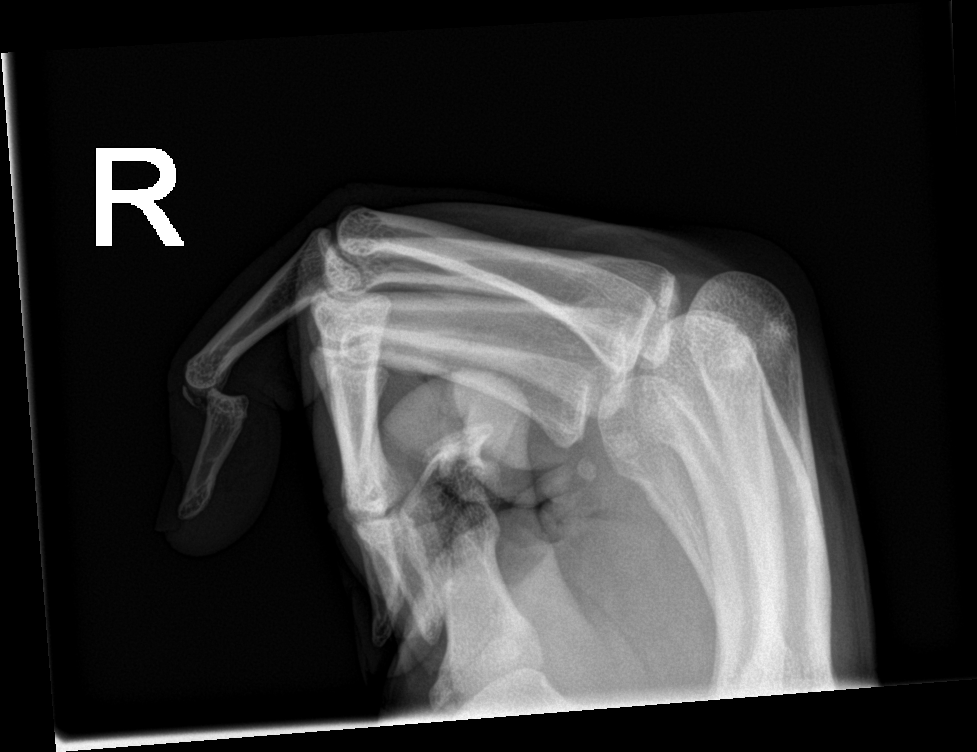

[1 of 1 positions shown; findings below may reference images not displayed]

FINDINGS: Single lateral view of the ring finger demonstrates volar
dislocation of the distal phalanx with respect to the proximal
phalanx. Curvilinear fracture fragment at the joint space, donor
site likely the middle phalanx.
IMPRESSION: Volar dislocation of the distal phalanx at the distal
interphalangeal joint. Curvilinear fracture fragment at the joint
space, donor site likely the middle phalanx.

## 2019-10-24 ENCOUNTER — Other Ambulatory Visit: Payer: Self-pay

## 2019-10-24 ENCOUNTER — Emergency Department (HOSPITAL_COMMUNITY)
Admission: EM | Admit: 2019-10-24 | Discharge: 2019-10-24 | Disposition: A | Discharge status: Home / Self Care | Payer: Medicaid Other | Attending: Emergency Medicine | Admitting: Emergency Medicine

## 2019-10-24 ENCOUNTER — Encounter (HOSPITAL_COMMUNITY): Payer: Self-pay | Admitting: Emergency Medicine

## 2019-10-24 DIAGNOSIS — R0602 Shortness of breath: Secondary | ICD-10-CM | POA: Diagnosis present

## 2019-10-24 DIAGNOSIS — J45909 Unspecified asthma, uncomplicated: Secondary | ICD-10-CM | POA: Insufficient documentation

## 2019-10-24 DIAGNOSIS — Z20822 Contact with and (suspected) exposure to covid-19: Secondary | ICD-10-CM | POA: Diagnosis not present

## 2019-10-24 DIAGNOSIS — F17290 Nicotine dependence, other tobacco product, uncomplicated: Secondary | ICD-10-CM | POA: Insufficient documentation

## 2019-10-24 DIAGNOSIS — B349 Viral infection, unspecified: Secondary | ICD-10-CM | POA: Diagnosis not present

## 2019-10-24 LAB — SARS CORONAVIRUS 2 (TAT 6-24 HRS): SARS Coronavirus 2: NEGATIVE

## 2019-10-24 MED ORDER — IBUPROFEN 400 MG PO TABS
600.0000 mg | ORAL_TABLET | Freq: Once | ORAL | Status: AC
  Administered 2019-10-24: 600 mg via ORAL
  Filled 2019-10-24: qty 1

## 2019-10-24 MED ORDER — ALBUTEROL SULFATE HFA 108 (90 BASE) MCG/ACT IN AERS: 2.0000 | INHALATION_SPRAY | RESPIRATORY_TRACT | 0 refills | Status: DC | PRN

## 2019-10-24 NOTE — ED Provider Notes (Addendum)
[facility] EMERGENCY DEPARTMENT Provider Note   CSN: [id number] Arrival date & time: 10/24/19  0102     History Chief Complaint  Patient presents with  . Headache    Justin Fletcher is a 19 y.o. male.  Pt reports hx of asthma.  Has been using his albuterol inhaler the past few mornings d/t SOB & congestion when he wakes up.  Reports frontal HA, rates pain 6/10.  Reports fatigue & chills.  He is concerned about possible COVID infection.  Took nyquil ~1800 last evening.  Denies photophobia, neck pain, n/v, or visual changes.  States he had a tonsillectomy ~2 years ago.   The history is provided by the patient.  URI Presenting symptoms: congestion, cough, fatigue and sore throat   Presenting symptoms: no fever   Congestion:    Location:  Nasal Cough:    Sputum characteristics:  White   Duration:  2 days   Timing:  Intermittent   Progression:  Waxing and waning   Chronicity:  New Fatigue:    Severity:  Moderate   Duration:  2 days   Progression:  Waxing and waning Sore throat:    Duration:  2 days Associated symptoms: headaches   Associated symptoms: no neck pain   Risk factors: sick contacts        Past Medical History:  Diagnosis Date  . Asthma   . CAP (community acquired pneumonia) 01/2017  . Seasonal allergies   . Sleep apnea    "was told I have it; never tested for it" (06/16/2018)    Patient Active Problem List   Diagnosis Date Noted  . Tonsillitis 06/16/2018    Past Surgical History:  Procedure Laterality Date  . CLOSED REDUCTION HAND FRACTURE Right 2015   "had screws put in"  . FRACTURE SURGERY    . TONSILLECTOMY Bilateral 06/16/2018  . TONSILLECTOMY Bilateral 06/16/2018   Procedure: TONSILLECTOMY;  Surgeon: Suzanna Obey, MD;  Location: [facility] OR;  Service: ENT;  Laterality: Bilateral;  . WISDOM TOOTH EXTRACTION         No family history on file.  Social History   Tobacco Use  . Smoking status: Current Every Day Smoker   Years: 0.70    Types: Cigars  . Smokeless tobacco: Never Used  . Tobacco comment: 06/16/2018 "3-4 day Black and Milds"  Substance Use Topics  . Alcohol use: Never  . Drug use: Yes    Types: Marijuana    Home Medications Prior to Admission medications   Medication Sig Start Date End Date Taking? Authorizing Provider  acetaminophen (TYLENOL) 500 MG tablet Take 1 tablet (500 mg total) by mouth every 6 (six) hours as needed. 07/22/18   Law, Waylan Boga, PA-C  albuterol (PROVENTIL HFA;VENTOLIN HFA) 108 (90 Base) MCG/ACT inhaler Inhale 1-2 puffs into the lungs every 6 (six) hours as needed for wheezing or shortness of breath. 01/23/18   Elvina Sidle, MD  albuterol (PROVENTIL HFA;VENTOLIN HFA) 108 (90 Base) MCG/ACT inhaler Inhale 1-2 puffs into the lungs every 6 (six) hours as needed for wheezing or shortness of breath. 07/22/18   Law, Waylan Boga, PA-C  albuterol (VENTOLIN HFA) 108 (90 Base) MCG/ACT inhaler Inhale 2 puffs into the lungs every 4 (four) hours as needed for wheezing or shortness of breath. 10/24/19   Viviano Simas, NP  benzonatate (TESSALON) 100 MG capsule Take 1 capsule (100 mg total) by mouth every 8 (eight) hours. 07/22/18   Law, Waylan Boga, PA-C  ibuprofen (ADVIL) 800 MG tablet  Take 1 tablet (800 mg total) by mouth 3 (three) times daily. 06/10/19   Larene Pickett, PA-C  ondansetron (ZOFRAN) 4 MG tablet Take 1 tablet (4 mg total) by mouth every 6 (six) hours. 07/22/18   Law, Bea Graff, PA-C  sodium chloride (OCEAN) 0.65 % SOLN nasal spray Place 1 spray into both nostrils as needed for congestion. 07/22/18   Frederica Kuster, PA-C    Allergies    Patient has no known allergies.  Review of Systems   Review of Systems  Constitutional: Positive for chills and fatigue. Negative for fever.  HENT: Positive for congestion and sore throat. Negative for trouble swallowing and voice change.   Eyes: Negative for photophobia and visual disturbance.  Respiratory: Positive for cough.     Gastrointestinal: Negative for nausea and vomiting.  Musculoskeletal: Negative for neck pain.  Skin: Negative for rash.  Neurological: Positive for headaches.  All other systems reviewed and are negative.   Physical Exam Updated Vital Signs BP 140/84 (BP Location: Left Arm)   Pulse 100   Temp 97.7 F (36.5 C) (Oral)   Resp 18   SpO2 97%   Physical Exam Vitals and nursing note reviewed.  Constitutional:      General: He is not in acute distress.    Appearance: He is obese. He is not toxic-appearing.  HENT:     Head: Normocephalic and atraumatic.     Mouth/Throat:     Mouth: Mucous membranes are moist.     Pharynx: Oropharynx is clear.  Eyes:     Extraocular Movements: Extraocular movements intact.     Pupils: Pupils are equal, round, and reactive to light.  Cardiovascular:     Rate and Rhythm: Normal rate and regular rhythm.     Heart sounds: Normal heart sounds.  Pulmonary:     Effort: Pulmonary effort is normal.     Breath sounds: Normal breath sounds.  Abdominal:     General: Bowel sounds are normal.     Palpations: Abdomen is soft.     Tenderness: There is no abdominal tenderness.  Musculoskeletal:        General: Normal range of motion.     Cervical back: Normal range of motion and neck supple. No rigidity.  Lymphadenopathy:     Cervical: No cervical adenopathy.  Skin:    General: Skin is warm and dry.     Capillary Refill: Capillary refill takes less than 2 seconds.     Findings: No rash.  Neurological:     Mental Status: He is alert.     GCS: GCS eye subscore is 4. GCS verbal subscore is 5. GCS motor subscore is 6.     Motor: No weakness.     Coordination: Coordination normal.     Gait: Gait normal.     ED Results / Procedures / Treatments   Labs (all labs ordered are listed, but only abnormal results are displayed) Labs Reviewed  SARS CORONAVIRUS 2 (TAT 6-24 HRS)    EKG None  Radiology No results found.  Procedures Procedures (including  critical care time)  Medications Ordered in ED Medications  ibuprofen (ADVIL) tablet 600 mg (600 mg Oral Given 10/24/19 0213)    ED Course  I have reviewed the triage vital signs and the nursing notes.  Pertinent labs & imaging results that were available during my care of the patient were reviewed by me and considered in my medical decision making (see chart for details).  MDM Rules/Calculators/A&P                      Well appearing 18 yom w/ hx asthma, presenting for 2d HA, fatique, chills, congestion, ST.  Requests testing for COVID. Well appearing on exam, no distress.  Ibuprofen given for HA.  Also requests rx for albuterol inhaler as his is almost empty.  BBS CTA, normal WOB & SpO2.  Normal neuro exam w/o deficits, no meningeal signs. Likely viral illness. Low suspicion for strep as he is s/p tonsillectomy.  Discussed supportive care as well need for f/u w/ PCP in 1-2 days.  Also discussed sx that warrant sooner re-eval in ED. Patient / Family / Caregiver informed of clinical course, understand medical decision-making process, and agree with plan.  Justin Fletcher was evaluated in Emergency Department on 10/24/2019 for the symptoms described in the history of present illness. He was evaluated in the context of the global COVID-19 pandemic, which necessitated consideration that the patient might be at risk for infection with the SARS-CoV-2 virus that causes COVID-19. Institutional protocols and algorithms that pertain to the evaluation of patients at risk for COVID-19 are in a state of rapid change based on information released by regulatory bodies including the CDC and federal and state organizations. These policies and algorithms were followed during the patient's care in the ED.   Final Clinical Impression(s) / ED Diagnoses Final diagnoses:  Viral illness    Rx / DC Orders ED Discharge Orders         Ordered    albuterol (VENTOLIN HFA) 108 (90 Base) MCG/ACT inhaler  Every 4 hours  PRN     10/24/19 0234           Viviano Simas, NP 10/24/19 0235    Viviano Simas, NP 10/24/19 0235    Ward, Layla Maw, DO 10/24/19 [phone]

## 2019-10-24 NOTE — ED Triage Notes (Signed)
Patient reports headache onset yesterday , denies head injury , no fever or chills , alert and oriented , denies emesis or photophobia .

## 2019-10-24 NOTE — Discharge Instructions (Addendum)
For your headache, you may take ibuprofen 800 mg every 8 hours as needed (4 tabs).  If you develop neck stiffness, persistent vomiting, trouble breathing, or if headache becomes severe, return to the ED immediately. Otherwise see your primary care physician in 2-3 days.   If your COVID test is positive, someone from the hospital will contact you.  You may also find the results on mychart.  Until you have results, isolate at home. Persons with COVID-19 who have symptoms and were directed to care for themselves at home may discontinue isolation under the following conditions:  At least 10 days have passed since symptom onset and At least 24 hours have passed since resolution of fever without the use of fever-reducing medications and Other symptoms have improved.

## 2022-02-24 ENCOUNTER — Ambulatory Visit: Payer: Medicaid Other | Admitting: Family Medicine

## 2022-06-16 ENCOUNTER — Encounter: Payer: Self-pay | Admitting: Critical Care Medicine

## 2022-06-16 ENCOUNTER — Other Ambulatory Visit (HOSPITAL_COMMUNITY)
Admission: RE | Admit: 2022-06-16 | Discharge: 2022-06-16 | Disposition: A | Discharge status: Home / Self Care | Payer: Medicaid Other | Source: Ambulatory Visit | Attending: Family Medicine | Admitting: Family Medicine

## 2022-06-16 ENCOUNTER — Ambulatory Visit: Payer: Medicaid Other | Attending: Family Medicine | Admitting: Critical Care Medicine

## 2022-06-16 VITALS — BP 150/90 | HR 70 | Temp 97.8°F | Resp 20 | Ht 68.0 in | Wt 338.0 lb

## 2022-06-16 DIAGNOSIS — E669 Obesity, unspecified: Secondary | ICD-10-CM | POA: Diagnosis not present

## 2022-06-16 DIAGNOSIS — Z6841 Body Mass Index (BMI) 40.0 and over, adult: Secondary | ICD-10-CM

## 2022-06-16 DIAGNOSIS — Z1159 Encounter for screening for other viral diseases: Secondary | ICD-10-CM | POA: Diagnosis not present

## 2022-06-16 DIAGNOSIS — J4540 Moderate persistent asthma, uncomplicated: Secondary | ICD-10-CM | POA: Insufficient documentation

## 2022-06-16 DIAGNOSIS — Z139 Encounter for screening, unspecified: Secondary | ICD-10-CM

## 2022-06-16 DIAGNOSIS — Z23 Encounter for immunization: Secondary | ICD-10-CM

## 2022-06-16 DIAGNOSIS — Z113 Encounter for screening for infections with a predominantly sexual mode of transmission: Secondary | ICD-10-CM | POA: Diagnosis present

## 2022-06-16 DIAGNOSIS — I10 Essential (primary) hypertension: Secondary | ICD-10-CM | POA: Diagnosis not present

## 2022-06-16 MED ORDER — AMLODIPINE BESYLATE 5 MG PO TABS: 5.0000 mg | ORAL_TABLET | Freq: Every day | ORAL | 2 refills | Status: DC

## 2022-06-16 MED ORDER — ALBUTEROL SULFATE HFA 108 (90 BASE) MCG/ACT IN AERS: 1.0000 | INHALATION_SPRAY | Freq: Four times a day (QID) | RESPIRATORY_TRACT | 11 refills | Status: DC | PRN

## 2022-06-16 MED ORDER — FLUTICASONE PROPIONATE 50 MCG/ACT NA SUSP: 2.0000 | Freq: Every day | NASAL | 6 refills | Status: AC

## 2022-06-16 MED ORDER — FLUTICASONE PROPIONATE HFA 44 MCG/ACT IN AERO: 2.0000 | INHALATION_SPRAY | Freq: Every day | RESPIRATORY_TRACT | 12 refills | Status: DC

## 2022-06-16 NOTE — Assessment & Plan Note (Signed)
BMI of 52 with comorbidity of asthma and hypertension  The following Lifestyle Medicine recommendations according to [place] [employer]estyle Medicine [facility]) were discussed and offered to patient who agrees to start the journey:  A. [employer], Plant-based plate comprising of fruits and vegetables, plant-based proteins, whole-grain carbohydrates was discussed in detail with the patient.   A list for source of those nutrients were also provided to the patient.  Patient will use only water or unsweetened tea for hydration. B.  The need to stay away from risky substances including alcohol, smoking; obtaining 7 to 9 hours of restorative sleep, at least 150 minutes of moderate intensity exercise weekly, the importance of healthy social connections,  and stress reduction techniques were discussed. C.  A full color page of  Calorie density of various food groups per pound showing examples of each food groups was provided to the patient.

## 2022-06-16 NOTE — Patient Instructions (Signed)
Begin Flovent 2 inhalations daily for asthma Albuterol was refilled can take 2 puffs 4 times a day as needed for wheezing and shortness of breath Start fluticasone nasal spray 2 sprays each nostril twice daily  Start amlodipine 1 pill daily for elevated blood pressure  Complete health screening and sexual transmitted disease screening was obtained  Follow the lifestyle medicine handout and remembers appointments as outlined below        Advice for Weight Management   -For most of [place] the best way to lose weight is by diet management. Generally speaking, diet management means consuming less calories intentionally which over time brings about progressive weight loss.  This can be achieved more effectively by avoiding ultra processed carbohydrates, processed meats, unhealthy fats.    It is critically important to know your numbers: how much calorie you are consuming and how much calorie you need. More importantly, our carbohydrates sources should be unprocessed naturally occurring  complex starch food items.  It is always important to balance nutrition also by  appropriate intake of proteins (mainly plant-based), healthy fats/oils, plenty of fruits and vegetables.    -The American College of Lifestyle Medicine (ACL M) recommends nutrition derived mostly from Whole Food, Plant Predominant Sources example an apple instead of applesauce or apple pie. Eat Plenty of vegetables, Mushrooms, fruits, Legumes, Whole Grains, Nuts, seeds in lieu of processed meats, processed snacks/pastries red meat, poultry, eggs.  Use only water or unsweetened tea for hydration.  The College also recommends the need to stay away from risky substances including alcohol, smoking; obtaining 7-9 hours of restorative sleep, at least 150 minutes of moderate intensity exercise weekly, importance of healthy social connections, and being mindful of stress and seek help when it is overwhelming.     -Sticking to a routine mealtime to eat 3  meals a day and avoiding unnecessary snacks is shown to have a big role in weight control. Under normal circumstances, the only time we burn stored energy is when we are hungry, so allow  some hunger to take place- hunger means no food between appropriate meal times, only water.  It is not advisable to starve.    -It is better to avoid simple carbohydrates including: Cakes, Sweet Desserts, Ice Cream, Soda (diet and regular), Sweet Tea, Candies, Chips, Cookies, Store Bought Juices, Alcohol in Excess of  1-2 drinks a day, Lemonade,  Artificial Sweeteners, Doughnuts, Coffee Creamers, "Sugar-free" Products, etc, etc.  This is not a complete list...Marland Kitchen.    -Consulting with certified diabetes educators is proven to provide you with the most accurate and current information on diet.  Also, you may be  interested in discussing diet options/exchanges , we can schedule a visit with Norm Salt, RDN, CDE for individualized nutrition education.   -Exercise: If you are able: 30 -60 minutes a day ,4 days a week, or 150 minutes of moderate intensity exercise weekly.    The longer the better if tolerated.  Combine stretch, strength, and aerobic activities.  If you were told in the past that you have high risk for cardiovascular diseases, or if you are currently symptomatic, you may seek evaluation by your heart doctor prior to initiating moderate to intense exercise programs.                                    Additional Care Considerations for Diabetes/Prediabetes     -Diabetes  is a chronic  disease.  The most important care consideration is regular follow-up with your diabetes care provider with the goal being avoiding or delaying its complications and to take advantage of advances in medications and technology.  If appropriate actions are taken early enough, type 2 diabetes can even be reversed.  Seek information from the right source.   - Whole Food, Plant Predominant Nutrition is highly recommended: Eat Plenty  of vegetables, Mushrooms, fruits, Legumes, Whole Grains, Nuts, seeds in lieu of processed meats, processed snacks/pastries red meat, poultry, eggs as recommended by [employer] of  Lifestyle Medicine (ACLM).   -Type 2 diabetes is known to coexist with other important comorbidities such as high blood pressure and high cholesterol.  It is critical to control not only the diabetes but also the high blood pressure and high cholesterol to minimize and delay the risk of complications including coronary artery disease, stroke, amputations, blindness, etc.  The good news is that this diet recommendation for type 2 diabetes is also very helpful for managing high cholesterol and high blood blood pressure.   - Studies showed that people with diabetes will benefit from a class of medications known as ACE inhibitors and statins.  Unless there are specific reasons not to be on these medications, the standard of care is to consider getting one from these groups of medications at an optimal doses.  These medications are generally considered safe and proven to help protect the heart and the kidneys.     - People with diabetes are encouraged to initiate and maintain regular follow-up with eye doctors, foot doctors, dentists , and if necessary heart and kidney doctors.      - It is highly recommended that people with diabetes quit smoking or stay away from smoking, and get yearly  flu vaccine and pneumonia vaccine at least every 5 years.  See above for additional recommendations on exercise, sleep, stress management , and healthy social connections.     Return to see Dr. Joya Gaskins 3 months and see Lurena Joiner our clinical pharmacist in 4 weeks for blood pressure

## 2022-06-16 NOTE — Assessment & Plan Note (Signed)
Plan to begin amlodipine 5 mg daily have the patient come back in short-term follow-up The following Lifestyle Medicine recommendations according to [facility]) were discussed and offered to patient who agrees to start the journey:  A. Whole Foods, Plant-based plate comprising of fruits and vegetables, plant-based proteins, whole-grain carbohydrates was discussed in detail with the patient.   A list for source of those nutrients were also provided to the patient.  Patient will use only water or unsweetened tea for hydration. B.  The need to stay away from risky substances including alcohol, smoking; obtaining 7 to 9 hours of restorative sleep, at least 150 minutes of moderate intensity exercise weekly, the importance of healthy social connections,  and stress reduction techniques were discussed. C.  A full color page of  Calorie density of various food groups per pound showing examples of each food groups was provided to the patient.

## 2022-06-16 NOTE — Progress Notes (Signed)
New Patient Office Visit  Subjective    Patient ID: Justin Fletcher, male    DOB: 12-06-2000  Age: 21 y.o. MRN: [id number]  CC:  Chief Complaint  Patient presents with   Medication Refill   Establish Care    HPI Chosen Geske presents to establish care This patient has history of of morbid obesity not seen by any primary care provider in several years.  He is here to establish care.  History of sleep apnea not formally studied or treated.  He eats a lot of fast foods and processed foods.  He does have wheezing and cough in the morning with history of asthma.  Blood pressure on arrival 150/90.  Patient now vapes nicotine.  He is sexually active with 3 partners does use a condom would like sexual transmitted disease screening.  Agrees to and received the flu vaccine.  There are no other complaints.  He does drink 2 shots of alcohol a week.   Outpatient Encounter Medications as of 06/16/2022  Medication Sig   amLODipine (NORVASC) 5 MG tablet Take 1 tablet (5 mg total) by mouth daily.   fluticasone (FLONASE) 50 MCG/ACT nasal spray Place 2 sprays into both nostrils daily.   fluticasone (FLOVENT HFA) 44 MCG/ACT inhaler Inhale 2 puffs into the lungs daily.   albuterol (VENTOLIN HFA) 108 (90 Base) MCG/ACT inhaler Inhale 1-2 puffs into the lungs every 6 (six) hours as needed for wheezing or shortness of breath.   [DISCONTINUED] acetaminophen (TYLENOL) 500 MG tablet Take 1 tablet (500 mg total) by mouth every 6 (six) hours as needed. (Patient not taking: Reported on 06/16/2022)   [DISCONTINUED] albuterol (PROVENTIL HFA;VENTOLIN HFA) 108 (90 Base) MCG/ACT inhaler Inhale 1-2 puffs into the lungs every 6 (six) hours as needed for wheezing or shortness of breath. (Patient not taking: Reported on 06/16/2022)   [DISCONTINUED] albuterol (PROVENTIL HFA;VENTOLIN HFA) 108 (90 Base) MCG/ACT inhaler Inhale 1-2 puffs into the lungs every 6 (six) hours as needed for wheezing or shortness of breath. (Patient not  taking: Reported on 06/16/2022)   [DISCONTINUED] albuterol (VENTOLIN HFA) 108 (90 Base) MCG/ACT inhaler Inhale 2 puffs into the lungs every 4 (four) hours as needed for wheezing or shortness of breath. (Patient not taking: Reported on 06/16/2022)   [DISCONTINUED] benzonatate (TESSALON) 100 MG capsule Take 1 capsule (100 mg total) by mouth every 8 (eight) hours. (Patient not taking: Reported on 06/16/2022)   [DISCONTINUED] ibuprofen (ADVIL) 800 MG tablet Take 1 tablet (800 mg total) by mouth 3 (three) times daily. (Patient not taking: Reported on 06/16/2022)   [DISCONTINUED] ondansetron (ZOFRAN) 4 MG tablet Take 1 tablet (4 mg total) by mouth every 6 (six) hours. (Patient not taking: Reported on 06/16/2022)   [DISCONTINUED] sodium chloride (OCEAN) 0.65 % SOLN nasal spray Place 1 spray into both nostrils as needed for congestion. (Patient not taking: Reported on 06/16/2022)   No facility-administered encounter medications on file as of 06/16/2022.    Past Medical History:  Diagnosis Date   Asthma    CAP (community acquired pneumonia) 01/2017   Seasonal allergies    Sleep apnea    "was told I have it; never tested for it" (06/16/2018)    Past Surgical History:  Procedure Laterality Date   CLOSED REDUCTION HAND FRACTURE Right 2015   "had screws put in"   FRACTURE SURGERY     TONSILLECTOMY Bilateral 06/16/2018   TONSILLECTOMY Bilateral 06/16/2018   Procedure: TONSILLECTOMY;  Surgeon: Melissa Montane, MD;  Location: [facility];  Service:  ENT;  Laterality: Bilateral;   WISDOM TOOTH EXTRACTION      History reviewed. No pertinent family history.  Social History   Socioeconomic History   Marital status: Single    Spouse name: Not on file   Number of children: Not on file   Years of education: 12   Highest education level: Not on file  Occupational History   Occupation: junking cars work  Tobacco Use   Smoking status: Former    Types: Cigars    Quit date: 06/09/2022    Years since quitting: 0.0    Smokeless tobacco: Never   Tobacco comments:    06/16/2018 "3-4 day Black and Milds"  Vaping Use   Vaping Use: Every day   Substances: Nicotine   Devices: contains  Substance and Sexual Activity   Alcohol use: Yes    Alcohol/week: 2.0 standard drinks of alcohol    Types: 2 Shots of liquor per week    Comment: 2 shots per weekend   Drug use: Yes    Frequency: 7.0 times per week    Types: Marijuana   Sexual activity: Yes    Birth control/protection: Condom  Other Topics Concern   Not on file  Social History Narrative   Not on file   Social Determinants of Health   Financial Resource Strain: Not on file  Food Insecurity: Not on file  Transportation Needs: Not on file  Physical Activity: Not on file  Stress: Not on file  Social Connections: Not on file  Intimate Partner Violence: Not on file    Review of Systems  Constitutional:  Positive for malaise/fatigue. Negative for chills, diaphoresis, fever and weight loss.  HENT:  Positive for congestion. Negative for ear discharge, ear pain, hearing loss, nosebleeds, sore throat and tinnitus.   Eyes:  Negative for blurred vision, photophobia and redness.  Respiratory:  Positive for cough, shortness of breath and wheezing. Negative for hemoptysis, sputum production and stridor.   Cardiovascular:  Positive for chest pain. Negative for palpitations, orthopnea, claudication, leg swelling and PND.  Gastrointestinal:  Negative for abdominal pain, blood in stool, constipation, diarrhea, heartburn, melena, nausea and vomiting.  Genitourinary:  Negative for dysuria, flank pain, frequency, hematuria and urgency.  Musculoskeletal:  Positive for neck pain. Negative for back pain, falls, joint pain and myalgias.  Skin:  Negative for itching and rash.  Neurological:  Negative for dizziness, tingling, tremors, sensory change, speech change, focal weakness, seizures, loss of consciousness, weakness and headaches.  Endo/Heme/Allergies:  Positive for  environmental allergies. Negative for polydipsia. Does not bruise/bleed easily.  Psychiatric/Behavioral:  Negative for depression, hallucinations, memory loss, substance abuse and suicidal ideas. The patient is not nervous/anxious and does not have insomnia.         Objective    BP (!) 150/90 (BP Location: Left Arm, Patient Position: Sitting)   Pulse 70   Temp 97.8 F (36.6 C) (Oral)   Resp 20   Ht 5\' 8"  (1.727 m)   Wt (!) 338 lb (153.3 kg)   SpO2 99%   BMI 51.39 kg/m   Physical Exam Vitals reviewed.  Constitutional:      Appearance: Normal appearance. He is well-developed. He is obese. He is not diaphoretic.  HENT:     Head: Normocephalic and atraumatic.     Nose: No nasal deformity, septal deviation, mucosal edema or rhinorrhea.     Right Sinus: No maxillary sinus tenderness or frontal sinus tenderness.     Left Sinus: No maxillary sinus  tenderness or frontal sinus tenderness.     Mouth/Throat:     Pharynx: No oropharyngeal exudate.  Eyes:     General: No scleral icterus.    Conjunctiva/sclera: Conjunctivae normal.     Pupils: Pupils are equal, round, and reactive to light.  Neck:     Thyroid: No thyromegaly.     Vascular: No carotid bruit or JVD.     Trachea: Trachea normal. No tracheal tenderness or tracheal deviation.  Cardiovascular:     Rate and Rhythm: Normal rate and regular rhythm.     Chest Wall: PMI is not displaced.     Pulses: Normal pulses. No decreased pulses.     Heart sounds: Normal heart sounds, S1 normal and S2 normal. Heart sounds not distant. No murmur heard.    No systolic murmur is present.     No diastolic murmur is present.     No friction rub. No gallop. No S3 or S4 sounds.  Pulmonary:     Effort: Pulmonary effort is normal. No tachypnea, accessory muscle usage or respiratory distress.     Breath sounds: No stridor. Wheezing present. No decreased breath sounds, rhonchi or rales.  Chest:     Chest wall: No tenderness.  Abdominal:      General: Bowel sounds are normal. There is no distension.     Palpations: Abdomen is soft. Abdomen is not rigid.     Tenderness: There is no abdominal tenderness. There is no guarding or rebound.  Musculoskeletal:        General: Normal range of motion.     Cervical back: Normal range of motion and neck supple. No edema, erythema or rigidity. No muscular tenderness. Normal range of motion.  Lymphadenopathy:     Head:     Right side of head: No submental or submandibular adenopathy.     Left side of head: No submental or submandibular adenopathy.     Cervical: No cervical adenopathy.  Skin:    General: Skin is warm and dry.     Coloration: Skin is not pale.     Findings: No rash.     Nails: There is no clubbing.  Neurological:     Mental Status: He is alert and oriented to person, place, and time.     Sensory: No sensory deficit.  Psychiatric:        Mood and Affect: Mood normal.        Speech: Speech normal.        Behavior: Behavior normal.        Thought Content: Thought content normal.        Judgment: Judgment normal.         Assessment & Plan:   Problem List Items Addressed This Visit       Cardiovascular and Mediastinum   Primary hypertension - Primary    Plan to begin amlodipine 5 mg daily have the patient come back in short-term follow-up The following Lifestyle Medicine recommendations according to American College of Lifestyle Medicine [facility]) were discussed and offered to patient who agrees to start the journey:  A. Whole Foods, Plant-based plate comprising of fruits and vegetables, plant-based proteins, whole-grain carbohydrates was discussed in detail with the patient.   A list for source of those nutrients were also provided to the patient.  Patient will use only water or unsweetened tea for hydration. B.  The need to stay away from risky substances including alcohol, smoking; obtaining 7 to 9 hours of restorative sleep, at  least 150 minutes of moderate intensity  exercise weekly, the importance of healthy social connections,  and stress reduction techniques were discussed. C.  A full color page of  Calorie density of various food groups per pound showing examples of each food groups was provided to the patient.       Relevant Medications   amLODipine (NORVASC) 5 MG tablet     Respiratory   Asthma, moderate persistent    Plan to begin Flovent 2 puffs twice daily and Flonase daily albuterol as needed      Relevant Medications   fluticasone (FLOVENT HFA) 44 MCG/ACT inhaler   albuterol (VENTOLIN HFA) 108 (90 Base) MCG/ACT inhaler     Other   BMI 50.0-59.9, adult (HCC)    BMI of 52 with comorbidity of asthma and hypertension  The following Lifestyle Medicine recommendations according to American College of Lifestyle Medicine [facility]) were discussed and offered to patient who agrees to start the journey:  A. Whole Foods, Plant-based plate comprising of fruits and vegetables, plant-based proteins, whole-grain carbohydrates was discussed in detail with the patient.   A list for source of those nutrients were also provided to the patient.  Patient will use only water or unsweetened tea for hydration. B.  The need to stay away from risky substances including alcohol, smoking; obtaining 7 to 9 hours of restorative sleep, at least 150 minutes of moderate intensity exercise weekly, the importance of healthy social connections,  and stress reduction techniques were discussed. C.  A full color page of  Calorie density of various food groups per pound showing examples of each food groups was provided to the patient.       Screen for STD (sexually transmitted disease)   Relevant Orders   RPR   HIV Antibody (routine testing w rflx)   Urine cytology ancillary only   Other Visit Diagnoses     Need for hepatitis C screening test       Relevant Orders   HCV Ab w Reflex to Quant PCR   Encounter for health-related screening       Relevant Orders   CBC with  Differential/Platelet   Lipid panel   Need for immunization against influenza       Relevant Orders   Flu Vaccine QUAD 42mo+IM (Fluarix, Fluzone & Alfiuria Quad PF) (Completed)     Patient declined HPV vaccine did receive flu vaccine 45 minutes spent extra time needed for patient education complex decision making is high high risk of chronic illness progression lifestyle medicine education Return in about 3 months (around 09/16/2022) for htn, obesity, asthma.   Shan Levans, MD

## 2022-06-16 NOTE — Assessment & Plan Note (Signed)
Plan to begin Flovent 2 puffs twice daily and Flonase daily albuterol as needed

## 2022-06-17 ENCOUNTER — Telehealth: Payer: Self-pay

## 2022-06-17 LAB — RPR: RPR Ser Ql: NONREACTIVE

## 2022-06-17 LAB — URINE CYTOLOGY ANCILLARY ONLY
Chlamydia: NEGATIVE
Comment: NEGATIVE
Comment: NEGATIVE
Comment: NORMAL
Neisseria Gonorrhea: NEGATIVE
Trichomonas: NEGATIVE

## 2022-06-17 LAB — HCV INTERPRETATION

## 2022-06-17 LAB — COMPREHENSIVE METABOLIC PANEL
ALT: 46 IU/L — ABNORMAL HIGH (ref 0–44)
AST: 24 IU/L (ref 0–40)
Albumin/Globulin Ratio: 1.4 (ref 1.2–2.2)
Albumin: 4.3 g/dL (ref 4.3–5.2)
Alkaline Phosphatase: 56 IU/L (ref 44–121)
BUN/Creatinine Ratio: 12 (ref 9–20)
BUN: 12 mg/dL (ref 6–20)
Bilirubin Total: 0.3 mg/dL (ref 0.0–1.2)
CO2: 23 mmol/L (ref 20–29)
Calcium: 9.6 mg/dL (ref 8.7–10.2)
Chloride: 104 mmol/L (ref 96–106)
Creatinine, Ser: 0.98 mg/dL (ref 0.76–1.27)
Globulin, Total: 3.1 g/dL (ref 1.5–4.5)
Glucose: 93 mg/dL (ref 70–99)
Potassium: 4.4 mmol/L (ref 3.5–5.2)
Sodium: 145 mmol/L — ABNORMAL HIGH (ref 134–144)
Total Protein: 7.4 g/dL (ref 6.0–8.5)
eGFR: 111 mL/min/{1.73_m2} (ref >59)

## 2022-06-17 LAB — CBC WITH DIFFERENTIAL/PLATELET
Basophils Absolute: 0 10*3/uL (ref 0.0–0.2)
Basos: 0 %
EOS (ABSOLUTE): 0.2 10*3/uL (ref 0.0–0.4)
Eos: 2 %
Hematocrit: 49.9 % (ref 37.5–51.0)
Hemoglobin: 15.9 g/dL (ref 13.0–17.7)
Immature Grans (Abs): 0 10*3/uL (ref 0.0–0.1)
Immature Granulocytes: 0 %
Lymphocytes Absolute: 2.4 10*3/uL (ref 0.7–3.1)
Lymphs: 32 %
MCH: 26.1 pg — ABNORMAL LOW (ref 26.6–33.0)
MCHC: 31.9 g/dL (ref 31.5–35.7)
MCV: 82 fL (ref 79–97)
Monocytes Absolute: 0.8 10*3/uL (ref 0.1–0.9)
Monocytes: 10 %
Neutrophils Absolute: 4.2 10*3/uL (ref 1.4–7.0)
Neutrophils: 56 %
Platelets: 302 10*3/uL (ref 150–450)
RBC: 6.09 x10E6/uL — ABNORMAL HIGH (ref 4.14–5.80)
RDW: 13.2 % (ref 11.6–15.4)
WBC: 7.6 10*3/uL (ref 3.4–10.8)

## 2022-06-17 LAB — HEMOGLOBIN A1C
Est. average glucose Bld gHb Est-mCnc: 111 mg/dL
Hgb A1c MFr Bld: 5.5 % (ref 4.8–5.6)

## 2022-06-17 LAB — LIPID PANEL
Chol/HDL Ratio: 4.3 ratio (ref 0.0–5.0)
Cholesterol, Total: 158 mg/dL (ref 100–199)
HDL: 37 mg/dL — ABNORMAL LOW (ref >39)
LDL Chol Calc (NIH): 101 mg/dL — ABNORMAL HIGH (ref 0–99)
Triglycerides: 110 mg/dL (ref 0–149)
VLDL Cholesterol Cal: 20 mg/dL (ref 5–40)

## 2022-06-17 LAB — HIV ANTIBODY (ROUTINE TESTING W REFLEX): HIV Screen 4th Generation wRfx: NONREACTIVE

## 2022-06-17 LAB — HCV AB W REFLEX TO QUANT PCR: HCV Ab: NONREACTIVE

## 2022-06-17 NOTE — Progress Notes (Signed)
Let pt know he as NO Sexually transmitted diseases all tests NEG

## 2022-06-17 NOTE — Progress Notes (Signed)
Let pt know blood count, liver , kidney normal  cholesterol is high recommend diet we discussed, no diabetes, no syphilis, hep C HIV neg, waiting on urine cytology

## 2022-06-17 NOTE — Telephone Encounter (Signed)
-----   Message from Elsie Stain, MD sent at 06/17/2022  6:35 AM EDT ----- Let pt know blood count, liver , kidney normal  cholesterol is high recommend diet we discussed, no diabetes, no syphilis, hep C HIV neg, waiting on urine cytology

## 2022-06-17 NOTE — Telephone Encounter (Signed)
Pt was called and vm was left, Information has been sent to nurse pool.   

## 2022-06-18 ENCOUNTER — Telehealth: Payer: Self-pay

## 2022-06-18 NOTE — Telephone Encounter (Signed)
-----   Message from Elsie Stain, MD sent at 06/17/2022  4:56 PM EDT ----- Let pt know he as NO Sexually transmitted diseases all tests NEG

## 2022-06-18 NOTE — Telephone Encounter (Signed)
Pt was called and vm was left, Information has been sent to nurse pool.   

## 2022-08-02 ENCOUNTER — Ambulatory Visit: Payer: Medicaid Other | Admitting: Pharmacist

## 2022-09-16 ENCOUNTER — Ambulatory Visit: Payer: Self-pay | Admitting: Critical Care Medicine

## 2022-09-16 NOTE — Progress Notes (Deleted)
New Patient Office Visit  Subjective    Patient ID: Justin Fletcher, male    DOB: 04/03/2001  Age: 22 y.o. MRN: [id number]  CC:  No chief complaint on file.   HPI Justin Fletcher presents to establish care This patient has history of of morbid obesity not seen by any primary care provider in several years.  He is here to establish care.  History of sleep apnea not formally studied or treated.  He eats a lot of fast foods and processed foods.  He does have wheezing and cough in the morning with history of asthma.  Blood pressure on arrival 150/90.  Patient now vapes nicotine.  He is sexually active with 3 partners does use a condom would like sexual transmitted disease screening.  Agrees to and received the flu vaccine.  There are no other complaints.  He does drink 2 shots of alcohol a week.   Outpatient Encounter Medications as of 09/16/2022  Medication Sig   albuterol (VENTOLIN HFA) 108 (90 Base) MCG/ACT inhaler Inhale 1-2 puffs into the lungs every 6 (six) hours as needed for wheezing or shortness of breath.   amLODipine (NORVASC) 5 MG tablet Take 1 tablet (5 mg total) by mouth daily.   fluticasone (FLONASE) 50 MCG/ACT nasal spray Place 2 sprays into both nostrils daily.   fluticasone (FLOVENT HFA) 44 MCG/ACT inhaler Inhale 2 puffs into the lungs daily.   No facility-administered encounter medications on file as of 09/16/2022.    Past Medical History:  Diagnosis Date   Asthma    CAP (community acquired pneumonia) 01/2017   Seasonal allergies    Sleep apnea    "was told I have it; never tested for it" (06/16/2018)    Past Surgical History:  Procedure Laterality Date   CLOSED REDUCTION HAND FRACTURE Right 2015   "had screws put in"   FRACTURE SURGERY     TONSILLECTOMY Bilateral 06/16/2018   TONSILLECTOMY Bilateral 06/16/2018   Procedure: TONSILLECTOMY;  Surgeon: Suzanna Obey, MD;  Location: [facility];  Service: ENT;  Laterality: Bilateral;   WISDOM TOOTH EXTRACTION      No family  history on file.  Social History   Socioeconomic History   Marital status: Single    Spouse name: Not on file   Number of children: Not on file   Years of education: 12   Highest education level: Not on file  Occupational History   Occupation: junking cars work  Tobacco Use   Smoking status: Former    Types: Cigars    Quit date: 06/09/2022    Years since quitting: 0.2   Smokeless tobacco: Never   Tobacco comments:    06/16/2018 "3-4 day Black and Milds"  Vaping Use   Vaping Use: Every day   Substances: Nicotine   Devices: contains  Substance and Sexual Activity   Alcohol use: Yes    Alcohol/week: 2.0 standard drinks of alcohol    Types: 2 Shots of liquor per week    Comment: 2 shots per weekend   Drug use: Yes    Frequency: 7.0 times per week    Types: Marijuana   Sexual activity: Yes    Birth control/protection: Condom  Other Topics Concern   Not on file  Social History Narrative   Not on file   Social Determinants of Health   Financial Resource Strain: Not on file  Food Insecurity: Not on file  Transportation Needs: Not on file  Physical Activity: Not on file  Stress: Not  on file  Social Connections: Not on file  Intimate Partner Violence: Not on file    Review of Systems  Constitutional:  Positive for malaise/fatigue. Negative for chills, diaphoresis, fever and weight loss.  HENT:  Positive for congestion. Negative for ear discharge, ear pain, hearing loss, nosebleeds, sore throat and tinnitus.   Eyes:  Negative for blurred vision, photophobia and redness.  Respiratory:  Positive for cough, shortness of breath and wheezing. Negative for hemoptysis, sputum production and stridor.   Cardiovascular:  Positive for chest pain. Negative for palpitations, orthopnea, claudication, leg swelling and PND.  Gastrointestinal:  Negative for abdominal pain, blood in stool, constipation, diarrhea, heartburn, melena, nausea and vomiting.  Genitourinary:  Negative for dysuria,  flank pain, frequency, hematuria and urgency.  Musculoskeletal:  Positive for neck pain. Negative for back pain, falls, joint pain and myalgias.  Skin:  Negative for itching and rash.  Neurological:  Negative for dizziness, tingling, tremors, sensory change, speech change, focal weakness, seizures, loss of consciousness, weakness and headaches.  Endo/Heme/Allergies:  Positive for environmental allergies. Negative for polydipsia. Does not bruise/bleed easily.  Psychiatric/Behavioral:  Negative for depression, hallucinations, memory loss, substance abuse and suicidal ideas. The patient is not nervous/anxious and does not have insomnia.         Objective    There were no vitals taken for this visit.  Physical Exam Vitals reviewed.  Constitutional:      Appearance: Normal appearance. He is well-developed. He is obese. He is not diaphoretic.  HENT:     Head: Normocephalic and atraumatic.     Nose: No nasal deformity, septal deviation, mucosal edema or rhinorrhea.     Right Sinus: No maxillary sinus tenderness or frontal sinus tenderness.     Left Sinus: No maxillary sinus tenderness or frontal sinus tenderness.     Mouth/Throat:     Pharynx: No oropharyngeal exudate.  Eyes:     General: No scleral icterus.    Conjunctiva/sclera: Conjunctivae normal.     Pupils: Pupils are equal, round, and reactive to light.  Neck:     Thyroid: No thyromegaly.     Vascular: No carotid bruit or JVD.     Trachea: Trachea normal. No tracheal tenderness or tracheal deviation.  Cardiovascular:     Rate and Rhythm: Normal rate and regular rhythm.     Chest Wall: PMI is not displaced.     Pulses: Normal pulses. No decreased pulses.     Heart sounds: Normal heart sounds, S1 normal and S2 normal. Heart sounds not distant. No murmur heard.    No systolic murmur is present.     No diastolic murmur is present.     No friction rub. No gallop. No S3 or S4 sounds.  Pulmonary:     Effort: Pulmonary effort is  normal. No tachypnea, accessory muscle usage or respiratory distress.     Breath sounds: No stridor. Wheezing present. No decreased breath sounds, rhonchi or rales.  Chest:     Chest wall: No tenderness.  Abdominal:     General: Bowel sounds are normal. There is no distension.     Palpations: Abdomen is soft. Abdomen is not rigid.     Tenderness: There is no abdominal tenderness. There is no guarding or rebound.  Musculoskeletal:        General: Normal range of motion.     Cervical back: Normal range of motion and neck supple. No edema, erythema or rigidity. No muscular tenderness. Normal range of motion.  Lymphadenopathy:     Head:     Right side of head: No submental or submandibular adenopathy.     Left side of head: No submental or submandibular adenopathy.     Cervical: No cervical adenopathy.  Skin:    General: Skin is warm and dry.     Coloration: Skin is not pale.     Findings: No rash.     Nails: There is no clubbing.  Neurological:     Mental Status: He is alert and oriented to person, place, and time.     Sensory: No sensory deficit.  Psychiatric:        Mood and Affect: Mood normal.        Speech: Speech normal.        Behavior: Behavior normal.        Thought Content: Thought content normal.        Judgment: Judgment normal.         Assessment & Plan:   Problem List Items Addressed This Visit   None Patient declined HPV vaccine did receive flu vaccine 45 minutes spent extra time needed for patient education complex decision making is high high risk of chronic illness progression lifestyle medicine education No follow-ups on file.   Asencion Noble, MD

## 2023-10-18 ENCOUNTER — Other Ambulatory Visit: Payer: Self-pay | Admitting: Critical Care Medicine

## 2023-10-18 NOTE — Telephone Encounter (Signed)
Last Fill: 06/16/22  Last OV: 06/16/22 Next OV: None Scheduled  Routing to provider for review/authorization.

## 2023-10-18 NOTE — Telephone Encounter (Signed)
 Copied from CRM [id number]. Topic: Clinical - Medication Refill >> Oct 18, 2023  3:00 PM Ivette P wrote: Most Recent Primary Care Visit:  Provider: BRIEN DOVER E  Department: [facility]  Visit Type: NEW PATIENT  Date: 06/16/2022  Medication: fluticasone  (FLOVENT  HFA) 44 MCG/ACT inhaler  Has the patient contacted their pharmacy? Yes (Agent: If no, request that the patient contact the pharmacy for the refill. If patient does not wish to contact the pharmacy document the reason why and proceed with request.) (Agent: If yes, when and what did the pharmacy advise?)  Is this the correct pharmacy for this prescription? Yes If no, delete pharmacy and type the correct one.  This is the patient's preferred pharmacy:  [id number] [place],  - [address] AT [facility] OF [address] & [facility] [address] [place] [address] Phone: [phone] Fax: [phone]   Has the prescription been filled recently? No  Is the patient out of the medication? Yes  Has the patient been seen for an appointment in the last year OR does the patient have an upcoming appointment? Yes  Can we respond through MyChart? No  Agent: Please be advised that Rx refills may take up to 3 business days. We ask that you follow-up with your pharmacy.

## 2023-10-20 ENCOUNTER — Ambulatory Visit: Payer: Medicaid Other | Admitting: Internal Medicine

## 2023-11-17 ENCOUNTER — Ambulatory Visit: Payer: Medicaid Other | Attending: Physician Assistant | Admitting: Physician Assistant

## 2023-11-17 VITALS — BP 146/94 | HR 81 | Wt 350.6 lb

## 2023-11-17 DIAGNOSIS — E639 Nutritional deficiency, unspecified: Secondary | ICD-10-CM

## 2023-11-17 DIAGNOSIS — R635 Abnormal weight gain: Secondary | ICD-10-CM | POA: Diagnosis not present

## 2023-11-17 DIAGNOSIS — Z131 Encounter for screening for diabetes mellitus: Secondary | ICD-10-CM | POA: Diagnosis not present

## 2023-11-17 DIAGNOSIS — Z9189 Other specified personal risk factors, not elsewhere classified: Secondary | ICD-10-CM

## 2023-11-17 DIAGNOSIS — J4540 Moderate persistent asthma, uncomplicated: Secondary | ICD-10-CM

## 2023-11-17 DIAGNOSIS — Z13220 Encounter for screening for lipoid disorders: Secondary | ICD-10-CM

## 2023-11-17 DIAGNOSIS — Z91199 Patient's noncompliance with other medical treatment and regimen due to unspecified reason: Secondary | ICD-10-CM

## 2023-11-17 DIAGNOSIS — Z833 Family history of diabetes mellitus: Secondary | ICD-10-CM

## 2023-11-17 DIAGNOSIS — I10 Essential (primary) hypertension: Secondary | ICD-10-CM | POA: Diagnosis not present

## 2023-11-17 MED ORDER — FLUTICASONE PROPIONATE HFA 44 MCG/ACT IN AERO: 2.0000 | INHALATION_SPRAY | Freq: Every day | RESPIRATORY_TRACT | 12 refills | Status: AC

## 2023-11-17 MED ORDER — ALBUTEROL SULFATE HFA 108 (90 BASE) MCG/ACT IN AERS: 1.0000 | INHALATION_SPRAY | Freq: Four times a day (QID) | RESPIRATORY_TRACT | 11 refills | Status: AC | PRN

## 2023-11-17 MED ORDER — AMLODIPINE BESYLATE 5 MG PO TABS: 5.0000 mg | ORAL_TABLET | Freq: Every day | ORAL | 2 refills | Status: AC

## 2023-11-17 NOTE — Progress Notes (Signed)
 Patient ID: Justin Fletcher, male   DOB: 22-Jun-2001, 23 y.o.   MRN: [id number]   Justin Fletcher, is a 23 y.o. male  WUJ:[id number]  [id number]  DOB - 12-Aug-2001  Chief Complaint  Patient presents with   Medication Refill   Cough       Subjective:   Justin Fletcher is a 23 y.o. male here today for "wanting to get back on health."  He has been out of medication for about 5 or 6 months.  BP was controlled at home when he was taking meds(his grandmother would check it).  He denies CP/SOB/ dizziness.  All family members have htn.  Several also with DM.    He used to be very active in high school and lift weights and play sports.  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  Says he eats 1 meal a day and usu overeats then.  Poor overall diet.  Weight in chart 03/2023=300 pounds-today he is 350 pounds.  He is fasting today  He is interested in weight loss medication   No problems updated.  ALLERGIES: No Known Allergies  PAST MEDICAL HISTORY: Past Medical History:  Diagnosis Date   Asthma    CAP (community acquired pneumonia) 01/2017   Seasonal allergies    Sleep apnea    "was told I have it; never tested for it" (06/16/2018)    MEDICATIONS AT HOME: Prior to Admission medications   Medication Sig Start Date End Date Taking? Authorizing Provider  albuterol (VENTOLIN HFA) 108 (90 Base) MCG/ACT inhaler Inhale 1-2 puffs into the lungs every 6 (six) hours as needed for wheezing or shortness of breath. 11/17/23   Anders Simmonds, PA-C  amLODipine (NORVASC) 5 MG tablet Take 1 tablet (5 mg total) by mouth daily. 11/17/23   Anders Simmonds, PA-C  fluticasone (FLONASE) 50 MCG/ACT nasal spray Place 2 sprays into both nostrils daily. 06/16/22   Storm Frisk, MD  fluticasone (FLOVENT HFA) 44 MCG/ACT inhaler Inhale 2 puffs into the lungs daily. 11/17/23   Annmargaret Decaprio, Marzella Schlein, PA-C    ROS: Neg HEENT Neg resp Neg cardiac Neg GI Neg GU Neg MS Neg psych Neg  neuro  Objective:   Vitals:   11/17/23 1439  BP: (!) 146/94  Pulse: 81  SpO2: 94%  Weight: (!) 350 lb 9.6 oz (159 kg)   Exam General appearance : Awake, alert, not in any distress. Speech Clear. Not toxic looking; morbidly obese.   HEENT: Atraumatic and Normocephalic Neck: Supple, no JVD. No cervical lymphadenopathy.  Chest: Good air entry bilaterally, CTAB.  No rales/rhonchi/wheezing CVS: S1 S2 regular, no murmurs.  Extremities: B/L Lower Ext shows no edema, both legs are warm to touch Neurology: Awake alert, and oriented X 3, CN II-XII intact, Non focal Skin: No Rash  Data Review Lab Results  Component Value Date   HGBA1C 5.5 06/16/2022    Assessment & Plan   1. Weight gain (Primary) Lifestyle medicine handout given.  Encouraged 3 nutritious meals and up to 1 healthy snack daily.  Don't skip meals.  Begin to get active even if it is 10-15 mins of a brisk walk daily then build on it.  Drink plenty of water (he says he does).  He says he is motivated to change - Vitamin D, 25-hydroxy - Comprehensive metabolic panel - TSH - Hemoglobin A1c  2. Primary hypertension Uncontrolled and had not followed up - Comprehensive metabolic panel - amLODipine (NORVASC) 5 MG tablet;  Take 1 tablet (5 mg total) by mouth daily.  Dispense: 90 tablet; Refill: 2  3. Screening for diabetes mellitus Work at a goal of eliminating sugary drinks, candy, desserts, sweets, refined sugars, processed foods, and white carbohydrates.    - Hemoglobin A1c  4. Family history of diabetes mellitus - Hemoglobin A1c  5. Noncompliance He is working to get back on track  6. Moderate persistent asthma without complication - albuterol (VENTOLIN HFA) 108 (90 Base) MCG/ACT inhaler; Inhale 1-2 puffs into the lungs every 6 (six) hours as needed for wheezing or shortness of breath.  Dispense: 1 each; Refill: 11 - fluticasone (FLOVENT HFA) 44 MCG/ACT inhaler; Inhale 2 puffs into the lungs daily.  Dispense: 1 each;  Refill: 12  7. Poor diet See #1 - Vitamin D, 25-hydroxy - Comprehensive metabolic panel  8. Screening cholesterol level - Lipid Panel  9. Sedentary lifestyle - TSH    Return in about 3 months (around 02/17/2024) for please assign him to one of our PCP.  The patient was given clear instructions to go to ER or return to medical center if symptoms don't improve, worsen or new problems develop. The patient verbalized understanding. The patient was told to call to get lab results if they haven't heard anything in the next week.      Georgian Co, PA-C [facility] [place], [place] [phone]   11/17/2023, 4:54 PM

## 2023-11-17 NOTE — Patient Instructions (Signed)
 Refer to lifestyle medicine handout and start making positive small changes weekly  Blood pressure goal is <130/<85

## 2023-11-18 ENCOUNTER — Other Ambulatory Visit: Payer: Self-pay | Admitting: Physician Assistant

## 2023-11-18 DIAGNOSIS — E559 Vitamin D deficiency, unspecified: Secondary | ICD-10-CM

## 2023-11-18 LAB — COMPREHENSIVE METABOLIC PANEL
ALT: 52 IU/L — ABNORMAL HIGH (ref 0–44)
AST: 27 IU/L (ref 0–40)
Albumin: 4.2 g/dL — ABNORMAL LOW (ref 4.3–5.2)
Alkaline Phosphatase: 67 IU/L (ref 44–121)
BUN/Creatinine Ratio: 14 (ref 9–20)
BUN: 13 mg/dL (ref 6–20)
Bilirubin Total: 0.3 mg/dL (ref 0.0–1.2)
CO2: 25 mmol/L (ref 20–29)
Calcium: 9.5 mg/dL (ref 8.7–10.2)
Chloride: 102 mmol/L (ref 96–106)
Creatinine, Ser: 0.96 mg/dL (ref 0.76–1.27)
Globulin, Total: 3.2 g/dL (ref 1.5–4.5)
Glucose: 86 mg/dL (ref 70–99)
Potassium: 4.5 mmol/L (ref 3.5–5.2)
Sodium: 139 mmol/L (ref 134–144)
Total Protein: 7.4 g/dL (ref 6.0–8.5)
eGFR: 113 mL/min/{1.73_m2} (ref >59)

## 2023-11-18 LAB — LIPID PANEL
Chol/HDL Ratio: 4.1 ratio (ref 0.0–5.0)
Cholesterol, Total: 154 mg/dL (ref 100–199)
HDL: 38 mg/dL — ABNORMAL LOW (ref >39)
LDL Chol Calc (NIH): 99 mg/dL (ref 0–99)
Triglycerides: 89 mg/dL (ref 0–149)
VLDL Cholesterol Cal: 17 mg/dL (ref 5–40)

## 2023-11-18 LAB — HEMOGLOBIN A1C
Est. average glucose Bld gHb Est-mCnc: 108 mg/dL
Hgb A1c MFr Bld: 5.4 % (ref 4.8–5.6)

## 2023-11-18 LAB — TSH: TSH: 1.73 u[IU]/mL (ref 0.450–4.500)

## 2023-11-18 LAB — VITAMIN D 25 HYDROXY (VIT D DEFICIENCY, FRACTURES): Vit D, 25-Hydroxy: 5.6 ng/mL — ABNORMAL LOW (ref 30.0–100.0)

## 2023-11-18 MED ORDER — VITAMIN D (ERGOCALCIFEROL) 1.25 MG (50000 UNIT) PO CAPS: 50000.0000 [IU] | ORAL_CAPSULE | ORAL | 0 refills | Status: AC

## 2023-12-21 ENCOUNTER — Ambulatory Visit: Admitting: Physician Assistant

## 2024-02-20 ENCOUNTER — Ambulatory Visit: Admitting: Internal Medicine

## 2024-05-15 ENCOUNTER — Other Ambulatory Visit: Payer: Self-pay | Admitting: Critical Care Medicine

## 2024-05-15 DIAGNOSIS — J4540 Moderate persistent asthma, uncomplicated: Secondary | ICD-10-CM

## 2024-05-15 NOTE — Telephone Encounter (Unsigned)
 Copied from CRM [id number]. Topic: Clinical - Medication Refill >> May 15, 2024  4:32 PM Marissa P wrote: Medication: fluticasone  (FLOVENT  HFA) 44 MCG/ACT inhaler  Has the patient contacted their pharmacy? No (Agent: If no, request that the patient contact the pharmacy for the refill. If patient does not wish to contact the pharmacy document the reason why and proceed with request.) (Agent: If yes, when and what did the pharmacy advise?)  This is the patient's preferred pharmacy:  [facility] #[id number] [place], [place] - [address] AT [facility] OF [address] & [facility] [address] [place] [address] Phone: [phone] Fax: [phone]  Is this the correct pharmacy for this prescription? Yes If no, delete pharmacy and type the correct one.   Has the prescription been filled recently? Yes  Is the patient out of the medication? Yes  Has the patient been seen for an appointment in the last year OR does the patient have an upcoming appointment? Yes  Can we respond through MyChart? No  Agent: Please be advised that Rx refills may take up to 3 business days. We ask that you follow-up with your pharmacy.

## 2024-05-16 NOTE — Telephone Encounter (Signed)
 Requested medication (s) are due for refill today: yes  Requested medication (s) are on the active medication list: yes  Last refill:  11/17/23  Future visit scheduled: No  Notes to clinic:  Unable to refill per protocol, cannot delegate.      Requested Prescriptions  Pending Prescriptions Disp Refills   fluticasone  (FLOVENT  HFA) 44 MCG/ACT inhaler 1 each 12    Sig: Inhale 2 puffs into the lungs daily.     Not Delegated - Pulmonology:  Corticosteroids 2 Failed - 05/16/2024 12:50 PM      Failed - This refill cannot be delegated      Passed - Valid encounter within last 12 months    Recent Outpatient Visits           6 months ago Weight gain   [facility] [place]. [facility] [place], Jon HERO, [place]   1 year ago Primary hypertension   [facility] [place]. [facility] Brien Belvie BRAVO, [place]
